# Patient Record
Sex: Female | Born: 1955
Health system: Southern US, Community
[De-identification: ages and names within clinical notes are randomized; demographics above are authoritative.]

## PROBLEM LIST (undated history)

## (undated) DIAGNOSIS — E785 Hyperlipidemia, unspecified: Secondary | ICD-10-CM

## (undated) DIAGNOSIS — I10 Essential (primary) hypertension: Secondary | ICD-10-CM

## (undated) DIAGNOSIS — F329 Major depressive disorder, single episode, unspecified: Secondary | ICD-10-CM

## (undated) DIAGNOSIS — Z8619 Personal history of other infectious and parasitic diseases: Secondary | ICD-10-CM

## (undated) DIAGNOSIS — F32A Depression, unspecified: Secondary | ICD-10-CM

## (undated) DIAGNOSIS — I6381 Other cerebral infarction due to occlusion or stenosis of small artery: Secondary | ICD-10-CM

## (undated) DIAGNOSIS — M65849 Other synovitis and tenosynovitis, unspecified hand: Secondary | ICD-10-CM

## (undated) DIAGNOSIS — M65839 Other synovitis and tenosynovitis, unspecified forearm: Secondary | ICD-10-CM

## (undated) DIAGNOSIS — N852 Hypertrophy of uterus: Secondary | ICD-10-CM

## (undated) HISTORY — DX: Hypertrophy of uterus: N85.2

## (undated) HISTORY — DX: Other synovitis and tenosynovitis, unspecified hand: M65.849

## (undated) HISTORY — PX: POLYPECTOMY: SHX149

## (undated) HISTORY — PX: BACK SURGERY: SHX140

## (undated) HISTORY — PX: COLONOSCOPY: SHX174

## (undated) HISTORY — PX: OTHER SURGICAL HISTORY: SHX169

## (undated) HISTORY — DX: Major depressive disorder, single episode, unspecified: F32.9

## (undated) HISTORY — DX: Personal history of other infectious and parasitic diseases: Z86.19

## (undated) HISTORY — DX: Hyperlipidemia, unspecified: E78.5

## (undated) HISTORY — DX: Other synovitis and tenosynovitis, unspecified forearm: M65.839

## (undated) HISTORY — PX: CERVICAL SPINE SURGERY: SHX589

## (undated) HISTORY — DX: Other cerebral infarction due to occlusion or stenosis of small artery: I63.81

## (undated) HISTORY — DX: Depression, unspecified: F32.A

---

## 1998-12-24 ENCOUNTER — Ambulatory Visit (HOSPITAL_COMMUNITY): Admission: RE | Admit: 1998-12-24 | Discharge: 1998-12-24 | Payer: Self-pay | Admitting: Neurosurgery

## 1998-12-24 ENCOUNTER — Encounter: Payer: Self-pay | Admitting: Neurosurgery

## 1999-01-25 ENCOUNTER — Encounter: Payer: Self-pay | Admitting: Neurosurgery

## 1999-01-27 ENCOUNTER — Encounter: Payer: Self-pay | Admitting: Neurosurgery

## 1999-01-27 ENCOUNTER — Inpatient Hospital Stay (HOSPITAL_COMMUNITY): Admission: RE | Admit: 1999-01-27 | Discharge: 1999-01-28 | Payer: Self-pay | Admitting: Neurosurgery

## 2006-06-27 ENCOUNTER — Ambulatory Visit: Payer: Self-pay | Admitting: Psychiatry

## 2006-07-06 ENCOUNTER — Inpatient Hospital Stay (HOSPITAL_COMMUNITY): Admission: RE | Admit: 2006-07-06 | Discharge: 2006-07-07 | Payer: Self-pay | Admitting: Neurosurgery

## 2006-12-03 ENCOUNTER — Ambulatory Visit: Payer: Self-pay | Admitting: Psychiatry

## 2007-01-07 ENCOUNTER — Ambulatory Visit: Payer: Self-pay | Admitting: Neurosurgery

## 2007-08-05 ENCOUNTER — Ambulatory Visit: Payer: Self-pay

## 2009-08-24 ENCOUNTER — Ambulatory Visit: Payer: Self-pay

## 2009-09-15 ENCOUNTER — Ambulatory Visit: Payer: Self-pay | Admitting: Family Medicine

## 2009-11-09 ENCOUNTER — Ambulatory Visit: Payer: Self-pay | Admitting: Gastroenterology

## 2010-04-24 ENCOUNTER — Ambulatory Visit: Payer: Self-pay | Admitting: Family Medicine

## 2010-08-03 ENCOUNTER — Emergency Department: Payer: Self-pay

## 2010-10-11 ENCOUNTER — Ambulatory Visit: Payer: Self-pay | Admitting: Family Medicine

## 2010-12-12 ENCOUNTER — Ambulatory Visit: Payer: Self-pay | Admitting: Family Medicine

## 2010-12-25 ENCOUNTER — Ambulatory Visit: Payer: Self-pay | Admitting: Family Medicine

## 2011-05-03 ENCOUNTER — Emergency Department: Payer: Self-pay | Admitting: Emergency Medicine

## 2011-05-10 ENCOUNTER — Ambulatory Visit: Payer: Self-pay | Admitting: Family Medicine

## 2011-09-13 ENCOUNTER — Ambulatory Visit: Payer: Self-pay | Admitting: Family Medicine

## 2011-11-13 ENCOUNTER — Ambulatory Visit: Payer: Self-pay | Admitting: Family Medicine

## 2012-04-23 ENCOUNTER — Encounter: Payer: Self-pay | Admitting: *Deleted

## 2012-04-28 ENCOUNTER — Ambulatory Visit: Payer: Self-pay | Admitting: Family Medicine

## 2012-05-01 ENCOUNTER — Encounter: Payer: Self-pay | Admitting: Family Medicine

## 2012-05-22 ENCOUNTER — Ambulatory Visit: Payer: Self-pay | Admitting: Neurology

## 2012-06-02 ENCOUNTER — Encounter: Payer: Self-pay | Admitting: Family Medicine

## 2012-06-02 ENCOUNTER — Ambulatory Visit (INDEPENDENT_AMBULATORY_CARE_PROVIDER_SITE_OTHER): Payer: Managed Care, Other (non HMO) | Admitting: Family Medicine

## 2012-06-02 VITALS — BP 126/74 | HR 58 | Temp 97.9°F | Resp 16 | Ht 65.5 in | Wt 188.2 lb

## 2012-06-02 DIAGNOSIS — E7800 Pure hypercholesterolemia, unspecified: Secondary | ICD-10-CM

## 2012-06-02 DIAGNOSIS — F329 Major depressive disorder, single episode, unspecified: Secondary | ICD-10-CM

## 2012-06-02 DIAGNOSIS — R413 Other amnesia: Secondary | ICD-10-CM

## 2012-06-02 DIAGNOSIS — R51 Headache: Secondary | ICD-10-CM

## 2012-06-02 LAB — CBC WITH DIFFERENTIAL/PLATELET
Basophils Absolute: 0 10*3/uL (ref 0.0–0.1)
Basophils Relative: 0 % (ref 0–1)
Eosinophils Absolute: 0.1 10*3/uL (ref 0.0–0.7)
Lymphs Abs: 3.2 10*3/uL (ref 0.7–4.0)
MCH: 28.2 pg (ref 26.0–34.0)
MCHC: 34.6 g/dL (ref 30.0–36.0)
Neutrophils Relative %: 37 % — ABNORMAL LOW (ref 43–77)
Platelets: 281 10*3/uL (ref 150–400)
RBC: 4.58 MIL/uL (ref 3.87–5.11)
RDW: 13.1 % (ref 11.5–15.5)
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
## 2012-06-02 NOTE — Patient Instructions (Addendum)
1. Pure hypercholesterolemia  CBC with Differential, Comprehensive metabolic panel, CK, Lipid panel  2. Persistent headaches    3. Memory impairment    4. Depression       1.  CONTINUE TO TAKE AMITRIPTYLINE AS RECOMMENDED BY NEUROLOGIST/SHAH. 2. RECOMMEND FOLLOW-UP APPOINTMENT WITH DENNIS CHRISMON, PA-C AT [facility].

## 2012-06-03 LAB — COMPREHENSIVE METABOLIC PANEL
ALT: 21 U/L (ref 0–35)
AST: 26 U/L (ref 0–37)
Alkaline Phosphatase: 61 U/L (ref 39–117)
Sodium: 141 mEq/L (ref 135–145)
Total Bilirubin: 0.4 mg/dL (ref 0.3–1.2)
Total Protein: 7.5 g/dL (ref 6.0–8.3)

## 2012-06-03 LAB — LIPID PANEL
HDL: 60 mg/dL (ref >39)
Total CHOL/HDL Ratio: 2.3 Ratio
VLDL: 15 mg/dL (ref 0–40)

## 2012-08-30 NOTE — Progress Notes (Deleted)
  Subjective:    Patient ID: Alexis [name], female    DOB: 1956/02/01, 57 y.o.   MRN: [id number]  HPI    Review of Systems     Objective:   Physical Exam        Assessment & Plan:

## 2012-08-30 NOTE — Progress Notes (Signed)
Reviewed and agree.

## 2012-12-09 ENCOUNTER — Ambulatory Visit: Payer: Self-pay | Admitting: Family Medicine

## 2012-12-16 ENCOUNTER — Other Ambulatory Visit: Payer: Self-pay | Admitting: Family Medicine

## 2013-10-13 DIAGNOSIS — R4182 Altered mental status, unspecified: Secondary | ICD-10-CM | POA: Insufficient documentation

## 2013-10-13 DIAGNOSIS — E7800 Pure hypercholesterolemia, unspecified: Secondary | ICD-10-CM | POA: Insufficient documentation

## 2013-10-13 DIAGNOSIS — M542 Cervicalgia: Secondary | ICD-10-CM | POA: Insufficient documentation

## 2013-10-13 DIAGNOSIS — R51 Headache: Secondary | ICD-10-CM

## 2013-10-13 DIAGNOSIS — I10 Essential (primary) hypertension: Secondary | ICD-10-CM | POA: Insufficient documentation

## 2013-10-13 DIAGNOSIS — D126 Benign neoplasm of colon, unspecified: Secondary | ICD-10-CM | POA: Insufficient documentation

## 2013-10-13 DIAGNOSIS — K5909 Other constipation: Secondary | ICD-10-CM | POA: Insufficient documentation

## 2013-10-13 DIAGNOSIS — R519 Headache, unspecified: Secondary | ICD-10-CM | POA: Insufficient documentation

## 2014-01-01 ENCOUNTER — Ambulatory Visit: Payer: Self-pay | Admitting: Family Medicine

## 2014-01-21 ENCOUNTER — Ambulatory Visit: Payer: Self-pay | Admitting: Family Medicine

## 2014-11-04 ENCOUNTER — Other Ambulatory Visit: Payer: Self-pay | Admitting: Neurology

## 2014-11-04 DIAGNOSIS — M545 Low back pain: Secondary | ICD-10-CM

## 2014-11-10 ENCOUNTER — Other Ambulatory Visit: Payer: Self-pay | Admitting: Neurology

## 2014-11-19 ENCOUNTER — Ambulatory Visit
Admission: RE | Admit: 2014-11-19 | Discharge: 2014-11-19 | Disposition: A | Discharge status: Home / Self Care | Payer: PPO | Source: Ambulatory Visit | Attending: Neurology | Admitting: Neurology

## 2014-11-19 DIAGNOSIS — M1288 Other specific arthropathies, not elsewhere classified, other specified site: Secondary | ICD-10-CM | POA: Diagnosis not present

## 2014-11-19 DIAGNOSIS — M545 Low back pain: Secondary | ICD-10-CM

## 2014-11-19 DIAGNOSIS — M5430 Sciatica, unspecified side: Secondary | ICD-10-CM | POA: Diagnosis present

## 2014-11-19 DIAGNOSIS — M5126 Other intervertebral disc displacement, lumbar region: Secondary | ICD-10-CM | POA: Insufficient documentation

## 2014-12-07 DIAGNOSIS — M545 Low back pain, unspecified: Secondary | ICD-10-CM | POA: Insufficient documentation

## 2015-01-04 ENCOUNTER — Encounter: Payer: Self-pay | Admitting: Family Medicine

## 2015-01-04 ENCOUNTER — Ambulatory Visit (INDEPENDENT_AMBULATORY_CARE_PROVIDER_SITE_OTHER): Payer: PPO | Admitting: Family Medicine

## 2015-01-04 VITALS — BP 128/72 | HR 64 | Temp 97.7°F | Resp 16 | Ht 65.5 in | Wt 198.0 lb

## 2015-01-04 DIAGNOSIS — I10 Essential (primary) hypertension: Secondary | ICD-10-CM | POA: Diagnosis not present

## 2015-01-04 DIAGNOSIS — R32 Unspecified urinary incontinence: Secondary | ICD-10-CM | POA: Insufficient documentation

## 2015-01-04 DIAGNOSIS — Z1231 Encounter for screening mammogram for malignant neoplasm of breast: Secondary | ICD-10-CM

## 2015-01-04 DIAGNOSIS — R413 Other amnesia: Secondary | ICD-10-CM | POA: Diagnosis not present

## 2015-01-04 DIAGNOSIS — M542 Cervicalgia: Secondary | ICD-10-CM | POA: Diagnosis not present

## 2015-01-04 DIAGNOSIS — F329 Major depressive disorder, single episode, unspecified: Secondary | ICD-10-CM | POA: Diagnosis not present

## 2015-01-04 DIAGNOSIS — G4733 Obstructive sleep apnea (adult) (pediatric): Secondary | ICD-10-CM | POA: Diagnosis not present

## 2015-01-04 DIAGNOSIS — R079 Chest pain, unspecified: Secondary | ICD-10-CM

## 2015-01-04 DIAGNOSIS — R51 Headache: Secondary | ICD-10-CM | POA: Diagnosis not present

## 2015-01-04 DIAGNOSIS — Z01419 Encounter for gynecological examination (general) (routine) without abnormal findings: Secondary | ICD-10-CM

## 2015-01-04 DIAGNOSIS — E7800 Pure hypercholesterolemia, unspecified: Secondary | ICD-10-CM

## 2015-01-04 DIAGNOSIS — K219 Gastro-esophageal reflux disease without esophagitis: Secondary | ICD-10-CM | POA: Diagnosis not present

## 2015-01-04 DIAGNOSIS — I209 Angina pectoris, unspecified: Secondary | ICD-10-CM

## 2015-01-04 DIAGNOSIS — I679 Cerebrovascular disease, unspecified: Secondary | ICD-10-CM | POA: Diagnosis not present

## 2015-01-04 DIAGNOSIS — F32A Depression, unspecified: Secondary | ICD-10-CM

## 2015-01-04 DIAGNOSIS — D219 Benign neoplasm of connective and other soft tissue, unspecified: Secondary | ICD-10-CM | POA: Insufficient documentation

## 2015-01-04 DIAGNOSIS — H9190 Unspecified hearing loss, unspecified ear: Secondary | ICD-10-CM | POA: Insufficient documentation

## 2015-01-04 DIAGNOSIS — G8929 Other chronic pain: Secondary | ICD-10-CM

## 2015-01-04 MED ORDER — METHOCARBAMOL 750 MG PO TABS: 750.0000 mg | ORAL_TABLET | Freq: Three times a day (TID) | ORAL | Status: DC | PRN
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
## 2015-01-04 NOTE — Patient Instructions (Signed)
Please check with your pharmacist to see if your insurance will cover the Shingles Vaccine and the Tdap vaccine

## 2015-01-05 ENCOUNTER — Telehealth: Payer: Self-pay

## 2015-01-05 ENCOUNTER — Encounter: Payer: Self-pay | Admitting: Family Medicine

## 2015-01-05 LAB — ALT: ALT: 27 IU/L (ref 0–32)

## 2015-01-05 LAB — RENAL FUNCTION PANEL
Albumin: 4.5 g/dL (ref 3.6–4.8)
BUN/Creatinine Ratio: 18 (ref 11–26)
BUN: 15 mg/dL (ref 8–27)
CALCIUM: 10.4 mg/dL — AB (ref 8.7–10.3)
CHLORIDE: 102 mmol/L (ref 97–108)
CO2: 27 mmol/L (ref 18–29)
CREATININE: 0.84 mg/dL (ref 0.57–1.00)
GFR calc Af Amer: 87 mL/min/{1.73_m2} (ref >59)
GFR calc non Af Amer: 75 mL/min/{1.73_m2} (ref >59)
Glucose: 86 mg/dL (ref 65–99)
Phosphorus: 3.7 mg/dL (ref 2.5–4.5)
Potassium: 5.2 mmol/L (ref 3.5–5.2)
SODIUM: 142 mmol/L (ref 134–144)

## 2015-01-05 LAB — LIPID PANEL
Chol/HDL Ratio: 3 ratio units (ref 0.0–4.4)
Cholesterol, Total: 222 mg/dL — ABNORMAL HIGH (ref 100–199)
HDL: 74 mg/dL (ref >39)
LDL Calculated: 133 mg/dL — ABNORMAL HIGH (ref 0–99)
TRIGLYCERIDES: 76 mg/dL (ref 0–149)
VLDL Cholesterol Cal: 15 mg/dL (ref 5–40)

## 2015-01-05 LAB — PAP IG (IMAGE GUIDED): PAP Smear Comment: 0

## 2015-01-05 LAB — TSH: TSH: 2.06 u[IU]/mL (ref 0.450–4.500)

## 2015-01-05 NOTE — Telephone Encounter (Signed)
LMTCB 01/05/2015  Thanks,   -Lorielle Boehning  

## 2015-01-05 NOTE — Telephone Encounter (Signed)
-----   Message from Birdie Sons, MD sent at 01/05/2015  9:28 AM EDT ----- Cholesterol a little high at 222. Otherwise labs, including thyroid, liver, kidney functions and electrolytes are all normal. Continue current medications.  Check labs yearly.

## 2015-01-06 NOTE — Telephone Encounter (Signed)
Patient notified of results. Expressed understanding.

## 2015-02-11 ENCOUNTER — Encounter: Payer: Self-pay | Admitting: *Deleted

## 2015-02-13 DIAGNOSIS — G709 Myoneural disorder, unspecified: Secondary | ICD-10-CM | POA: Diagnosis not present

## 2015-02-13 DIAGNOSIS — G43909 Migraine, unspecified, not intractable, without status migrainosus: Secondary | ICD-10-CM | POA: Diagnosis not present

## 2015-02-13 DIAGNOSIS — I10 Essential (primary) hypertension: Secondary | ICD-10-CM | POA: Diagnosis not present

## 2015-02-13 DIAGNOSIS — R1310 Dysphagia, unspecified: Secondary | ICD-10-CM | POA: Diagnosis not present

## 2015-02-13 DIAGNOSIS — E780 Pure hypercholesterolemia: Secondary | ICD-10-CM | POA: Diagnosis not present

## 2015-02-13 DIAGNOSIS — G4730 Sleep apnea, unspecified: Secondary | ICD-10-CM | POA: Diagnosis not present

## 2015-02-13 DIAGNOSIS — K219 Gastro-esophageal reflux disease without esophagitis: Secondary | ICD-10-CM | POA: Diagnosis not present

## 2015-02-13 DIAGNOSIS — Z79899 Other long term (current) drug therapy: Secondary | ICD-10-CM | POA: Diagnosis not present

## 2015-02-13 DIAGNOSIS — K5730 Diverticulosis of large intestine without perforation or abscess without bleeding: Secondary | ICD-10-CM | POA: Diagnosis not present

## 2015-02-13 DIAGNOSIS — D120 Benign neoplasm of cecum: Secondary | ICD-10-CM | POA: Diagnosis not present

## 2015-02-13 DIAGNOSIS — K5909 Other constipation: Secondary | ICD-10-CM | POA: Diagnosis not present

## 2015-02-13 DIAGNOSIS — Z7982 Long term (current) use of aspirin: Secondary | ICD-10-CM | POA: Diagnosis not present

## 2015-02-13 DIAGNOSIS — Z86010 Personal history of colonic polyps: Secondary | ICD-10-CM | POA: Diagnosis present

## 2015-02-13 DIAGNOSIS — F329 Major depressive disorder, single episode, unspecified: Secondary | ICD-10-CM | POA: Diagnosis not present

## 2015-02-13 NOTE — Anesthesia Preprocedure Evaluation (Addendum)
Anesthesia Evaluation  Patient identified by MRN, date of birth, ID band Patient awake    Reviewed: Allergy & Precautions, H&P , NPO status , Patient's Chart, lab work & pertinent test results, reviewed documented beta blocker date and time   Airway Mallampati: II  TM Distance: >3 FB Neck ROM: full    Dental  (+) Upper Dentures, Lower Dentures, Poor Dentition   Pulmonary sleep apnea ,  breath sounds clear to auscultation  Pulmonary exam normal       Cardiovascular Exercise Tolerance: Good hypertension, + angina Normal cardiovascular examRhythm:regular Rate:Normal     Neuro/Psych  Headaches,  Neuromuscular disease negative psych ROS   GI/Hepatic Neg liver ROS, GERD-  Controlled,  Endo/Other  negative endocrine ROS  Renal/GU negative Renal ROS  negative genitourinary   Musculoskeletal   Abdominal   Peds  Hematology negative hematology ROS (+)   Anesthesia Other Findings Past Medical History:   Depressive disorder, not elsewhere classified                Other tenosynovitis of hand and wrist                        Unspecified vitamin D deficiency                             Benign neoplasm of skin, site unspecified                    Degeneration of cervical intervertebral disc                 Memory loss                                                  Asymptomatic varicose veins                                  Mononeuritis of unspecified site                             Abnormality of gait                                          Lacunar infarction                                           Headache(784.0)                                              Hypertrophy of uterus                                          Comment:+ uterine fibroids by pelvic ultrasound 2012   Reproductive/Obstetrics negative OB ROS  Anesthesia Physical Anesthesia Plan  ASA:  III  Anesthesia Plan: General   Post-op Pain Management:    Induction:   Airway Management Planned:   Additional Equipment:   Intra-op Plan:   Post-operative Plan:   Informed Consent: I have reviewed the patients History and Physical, chart, labs and discussed the procedure including the risks, benefits and alternatives for the proposed anesthesia with the patient or authorized representative who has indicated his/her understanding and acceptance.   Dental Advisory Given  Plan Discussed with: Anesthesiologist, CRNA and Surgeon  Anesthesia Plan Comments:         Anesthesia Quick Evaluation

## 2015-02-14 ENCOUNTER — Encounter: Payer: Self-pay | Admitting: Anesthesiology

## 2015-02-14 ENCOUNTER — Encounter
Admission: RE | Disposition: A | Discharge status: Home / Self Care | Payer: Self-pay | Source: Ambulatory Visit | Attending: Gastroenterology

## 2015-02-14 ENCOUNTER — Ambulatory Visit
Admission: RE | Admit: 2015-02-14 | Discharge: 2015-02-14 | Disposition: A | Discharge status: Home / Self Care | Payer: PPO | Source: Ambulatory Visit | Attending: Gastroenterology | Admitting: Gastroenterology

## 2015-02-14 ENCOUNTER — Ambulatory Visit: Payer: PPO | Admitting: Anesthesiology

## 2015-02-14 DIAGNOSIS — G43909 Migraine, unspecified, not intractable, without status migrainosus: Secondary | ICD-10-CM | POA: Insufficient documentation

## 2015-02-14 DIAGNOSIS — R1310 Dysphagia, unspecified: Secondary | ICD-10-CM | POA: Insufficient documentation

## 2015-02-14 DIAGNOSIS — Z7982 Long term (current) use of aspirin: Secondary | ICD-10-CM | POA: Insufficient documentation

## 2015-02-14 DIAGNOSIS — G4730 Sleep apnea, unspecified: Secondary | ICD-10-CM | POA: Insufficient documentation

## 2015-02-14 DIAGNOSIS — E780 Pure hypercholesterolemia: Secondary | ICD-10-CM | POA: Insufficient documentation

## 2015-02-14 DIAGNOSIS — F329 Major depressive disorder, single episode, unspecified: Secondary | ICD-10-CM | POA: Insufficient documentation

## 2015-02-14 DIAGNOSIS — D120 Benign neoplasm of cecum: Secondary | ICD-10-CM | POA: Diagnosis not present

## 2015-02-14 DIAGNOSIS — Z86010 Personal history of colonic polyps: Secondary | ICD-10-CM | POA: Insufficient documentation

## 2015-02-14 DIAGNOSIS — K5909 Other constipation: Secondary | ICD-10-CM | POA: Insufficient documentation

## 2015-02-14 DIAGNOSIS — Z79899 Other long term (current) drug therapy: Secondary | ICD-10-CM | POA: Insufficient documentation

## 2015-02-14 DIAGNOSIS — K219 Gastro-esophageal reflux disease without esophagitis: Secondary | ICD-10-CM | POA: Insufficient documentation

## 2015-02-14 DIAGNOSIS — K5730 Diverticulosis of large intestine without perforation or abscess without bleeding: Secondary | ICD-10-CM | POA: Insufficient documentation

## 2015-02-14 DIAGNOSIS — I10 Essential (primary) hypertension: Secondary | ICD-10-CM | POA: Insufficient documentation

## 2015-02-14 DIAGNOSIS — G709 Myoneural disorder, unspecified: Secondary | ICD-10-CM | POA: Insufficient documentation

## 2015-02-14 HISTORY — PX: COLONOSCOPY WITH PROPOFOL: SHX5780

## 2015-02-14 SURGERY — COLONOSCOPY WITH PROPOFOL
Anesthesia: General

## 2015-02-14 MED ORDER — LIDOCAINE HCL (CARDIAC) 20 MG/ML IV SOLN
INTRAVENOUS | Status: DC | PRN
  Administered 2015-02-14: 20 mg via INTRAVENOUS

## 2015-02-14 MED ORDER — SODIUM CHLORIDE 0.9 % IV SOLN
INTRAVENOUS | Status: DC
  Administered 2015-02-14: 09:00:00 via INTRAVENOUS

## 2015-02-14 MED ORDER — PROPOFOL INFUSION 10 MG/ML OPTIME
INTRAVENOUS | Status: DC | PRN
  Administered 2015-02-14: 150 ug/kg/min via INTRAVENOUS

## 2015-02-14 MED ORDER — PROPOFOL 10 MG/ML IV BOLUS
INTRAVENOUS | Status: DC | PRN
  Administered 2015-02-14: 100 mg via INTRAVENOUS

## 2015-02-14 NOTE — Anesthesia Postprocedure Evaluation (Signed)
  Anesthesia Post-op Note  Patient: Alexis [name]  Procedure(s) Performed: Procedure(s): COLONOSCOPY WITH PROPOFOL (N/A)  Anesthesia type:General  Patient location: PACU  Post pain: Pain level controlled  Post assessment: Post-op Vital signs reviewed, Patient's Cardiovascular Status Stable, Respiratory Function Stable, Patent Airway and No signs of Nausea or vomiting  Post vital signs: Reviewed and stable  Last Vitals:  Filed Vitals:   02/14/15 1020  BP: 148/64  Pulse: 47  Temp:   Resp: 13    Level of consciousness: awake, alert  and patient cooperative  Complications: No apparent anesthesia complications

## 2015-02-14 NOTE — Op Note (Signed)
[facility] Gastroenterology Patient Name: Alexis [name] Procedure Date: 02/14/2015 9:29 AM MRN: [id number] Account #: [id number] Date of Birth: 1956/01/29 Admit Type: Outpatient Age: 59 Room: [facility] ENDO ROOM 4 Gender: Female Note Status: Finalized Procedure:         Colonoscopy Indications:       Personal history of colonic polyps Providers:         Lupita Dawn. Candace Cruise, MD Referring MD:      Kirstie Peri. Caryn Section, MD (Referring MD) Medicines:         Monitored Anesthesia Care Complications:     No immediate complications. Procedure:         Pre-Anesthesia Assessment:                    - Prior to the procedure, a History and Physical was                     performed, and patient medications, allergies and                     sensitivities were reviewed. The patient's tolerance of                     previous anesthesia was reviewed.                    - The risks and benefits of the procedure and the sedation                     options and risks were discussed with the patient. All                     questions were answered and informed consent was obtained.                    - After reviewing the risks and benefits, the patient was                     deemed in satisfactory condition to undergo the procedure.                    After obtaining informed consent, the colonoscope was                     passed under direct vision. Throughout the procedure, the                     patient's blood pressure, pulse, and oxygen saturations                     were monitored continuously. The Colonoscope was                     introduced through the anus and advanced to the the cecum,                     identified by appendiceal orifice and ileocecal valve. Findings:      A diminutive polyp was found in the cecum. The polyp was sessile. The       polyp was removed with a jumbo cold forceps. Resection and retrieval       were complete.      The exam was otherwise without  abnormality. Impression:        - One diminutive polyp in  the cecum. Resected and                     retrieved.                    - The examination was otherwise normal. Recommendation:    - Discharge patient to home.                    - Repeat colonoscopy in 5 years for surveillance.                    - The findings and recommendations were discussed with the                     patient. Procedure Code(s): --- Professional ---                    [phone], Colonoscopy, flexible; with biopsy, single or                     multiple Diagnosis Code(s): --- Professional ---                    D12.0, Benign neoplasm of cecum                    Z86.010, Personal history of colonic polyps CPT copyright 2014 American Medical Association. All rights reserved. The codes documented in this report are preliminary and upon coder review may  be revised to meet current compliance requirements. Hulen Luster, MD 02/14/2015 9:54:43 AM This report has been signed electronically. Number of Addenda: 0 Note Initiated On: 02/14/2015 9:29 AM Scope Withdrawal Time: 0 hours 9 minutes 23 seconds  Total Procedure Duration: 0 hours 12 minutes 18 seconds       [facility]

## 2015-02-14 NOTE — H&P (Signed)
  Date of Initial H&P: 02/02/2015.  History reviewed, patient examined, no change in status, stable for surgery.

## 2015-02-14 NOTE — Transfer of Care (Signed)
Immediate Anesthesia Transfer of Care Note  Patient: Alexis [name]  Procedure(s) Performed: Procedure(s): COLONOSCOPY WITH PROPOFOL (N/A)  Patient Location: PACU  Anesthesia Type:General  Level of Consciousness: awake  Airway & Oxygen Therapy: Patient Spontanous Breathing  Post-op Assessment: Report given to RN  Post vital signs: Reviewed  Last Vitals:  Filed Vitals:   02/14/15 0853  BP: 170/87  Pulse: 54  Temp: 35.8 C  Resp: 19    Complications: No apparent anesthesia complications

## 2015-02-15 LAB — SURGICAL PATHOLOGY

## 2015-02-17 ENCOUNTER — Encounter: Payer: Self-pay | Admitting: Gastroenterology

## 2015-02-25 ENCOUNTER — Ambulatory Visit: Payer: PPO | Admitting: Cardiovascular Disease

## 2015-02-28 ENCOUNTER — Encounter: Payer: Self-pay | Admitting: Family Medicine

## 2015-02-28 DIAGNOSIS — Z86010 Personal history of colonic polyps: Secondary | ICD-10-CM | POA: Insufficient documentation

## 2015-07-20 DIAGNOSIS — G5792 Unspecified mononeuropathy of left lower limb: Secondary | ICD-10-CM | POA: Diagnosis not present

## 2015-07-20 DIAGNOSIS — G959 Disease of spinal cord, unspecified: Secondary | ICD-10-CM | POA: Diagnosis not present

## 2015-07-20 DIAGNOSIS — G43719 Chronic migraine without aura, intractable, without status migrainosus: Secondary | ICD-10-CM | POA: Diagnosis not present

## 2015-09-06 ENCOUNTER — Telehealth: Payer: Self-pay

## 2015-09-06 ENCOUNTER — Emergency Department
Admission: EM | Admit: 2015-09-06 | Discharge: 2015-09-06 | Disposition: A | Discharge status: Home / Self Care | Payer: Commercial Managed Care - HMO | Attending: Emergency Medicine | Admitting: Emergency Medicine

## 2015-09-06 ENCOUNTER — Emergency Department: Payer: Commercial Managed Care - HMO

## 2015-09-06 ENCOUNTER — Encounter: Payer: Self-pay | Admitting: Emergency Medicine

## 2015-09-06 DIAGNOSIS — R51 Headache: Secondary | ICD-10-CM | POA: Diagnosis not present

## 2015-09-06 DIAGNOSIS — G43909 Migraine, unspecified, not intractable, without status migrainosus: Secondary | ICD-10-CM | POA: Diagnosis not present

## 2015-09-06 DIAGNOSIS — Z791 Long term (current) use of non-steroidal anti-inflammatories (NSAID): Secondary | ICD-10-CM | POA: Insufficient documentation

## 2015-09-06 DIAGNOSIS — Z7982 Long term (current) use of aspirin: Secondary | ICD-10-CM | POA: Insufficient documentation

## 2015-09-06 DIAGNOSIS — Z79899 Other long term (current) drug therapy: Secondary | ICD-10-CM | POA: Insufficient documentation

## 2015-09-06 DIAGNOSIS — H9201 Otalgia, right ear: Secondary | ICD-10-CM | POA: Insufficient documentation

## 2015-09-06 DIAGNOSIS — I10 Essential (primary) hypertension: Secondary | ICD-10-CM | POA: Diagnosis not present

## 2015-09-06 MED ORDER — SODIUM CHLORIDE 0.9 % IV BOLUS (SEPSIS)
1000.0000 mL | Freq: Once | INTRAVENOUS | Status: AC
  Administered 2015-09-06: 1000 mL via INTRAVENOUS

## 2015-09-06 MED ORDER — METOCLOPRAMIDE HCL 5 MG/ML IJ SOLN
10.0000 mg | Freq: Once | INTRAMUSCULAR | Status: AC
  Administered 2015-09-06: 10 mg via INTRAVENOUS

## 2015-09-06 MED ORDER — DIPHENHYDRAMINE HCL 50 MG/ML IJ SOLN
INTRAMUSCULAR | Status: AC
  Administered 2015-09-06: 50 mg via INTRAVENOUS
  Filled 2015-09-06: qty 1

## 2015-09-06 MED ORDER — KETOROLAC TROMETHAMINE 30 MG/ML IJ SOLN
30.0000 mg | Freq: Once | INTRAMUSCULAR | Status: AC
  Administered 2015-09-06: 30 mg via INTRAVENOUS

## 2015-09-06 MED ORDER — MORPHINE SULFATE (PF) 4 MG/ML IV SOLN
INTRAVENOUS | Status: AC
  Administered 2015-09-06: 4 mg via INTRAVENOUS
  Filled 2015-09-06: qty 1

## 2015-09-06 MED ORDER — MORPHINE SULFATE (PF) 4 MG/ML IV SOLN
4.0000 mg | Freq: Once | INTRAVENOUS | Status: AC
Start: 2015-09-06 — End: 2015-09-06
  Administered 2015-09-06: 4 mg via INTRAVENOUS

## 2015-09-06 MED ORDER — METOCLOPRAMIDE HCL 5 MG/ML IJ SOLN
INTRAMUSCULAR | Status: AC
  Administered 2015-09-06: 10 mg via INTRAVENOUS
  Filled 2015-09-06: qty 2

## 2015-09-06 MED ORDER — BUTALBITAL-APAP-CAFFEINE 50-325-40 MG PO CAPS: 1.0000 | ORAL_CAPSULE | ORAL | Status: DC | PRN

## 2015-09-06 MED ORDER — DIPHENHYDRAMINE HCL 50 MG/ML IJ SOLN
50.0000 mg | Freq: Once | INTRAMUSCULAR | Status: AC
  Administered 2015-09-06: 50 mg via INTRAVENOUS

## 2015-09-06 MED ORDER — KETOROLAC TROMETHAMINE 30 MG/ML IJ SOLN
INTRAMUSCULAR | Status: AC
  Administered 2015-09-06: 30 mg via INTRAVENOUS
  Filled 2015-09-06: qty 1

## 2015-09-06 NOTE — ED Provider Notes (Signed)
[facility] Emergency Department Provider Note  Time seen: 8:31 PM  I have reviewed the triage vital signs and the nursing notes.   HISTORY  Chief Complaint Headache    HPI Alexis [name] is a 60 y.o. female with a past medical history migraines, who presents to the emergency department with a headache. According to the patient for the past 2-3 days she has had a global headache but more right-sided than left-sided. States she has also felt somewhat off balance. States a history of migraine headaches, took her home migraine medications without reliefso she came to the emergency department for evaluation. Denies any neck pain or stiffness. Denies fever. Does state occasional right ear pain. Some nausea and ataxia she describes as mild. Describes the headache as severe 10/10 currently.     Past Medical History  Diagnosis Date  . Depressive disorder, not elsewhere classified   . Other tenosynovitis of hand and wrist   . Unspecified vitamin D deficiency   . Benign neoplasm of skin, site unspecified   . Degeneration of cervical intervertebral disc   . Memory loss   . Asymptomatic varicose veins   . Mononeuritis of unspecified site   . Abnormality of gait   . Lacunar infarction ([place])   . Headache(784.0)   . Hypertrophy of uterus     + uterine fibroids by pelvic ultrasound 2012    Patient Active Problem List   Diagnosis Date Noted  . History of adenomatous polyp of colon 02/28/2015  . Fibroids 01/04/2015  . Memory difficulty 01/04/2015  . Hearing loss   . Cerebrovascular disease   . Esophageal reflux   . Obstructive sleep apnea   . Urinary incontinence   . Depression   . LBP (low back pain) 12/07/2014  . Benign neoplasm of colon 10/13/2013  . Cervical pain 10/13/2013  . Chronic constipation 10/13/2013  . Benign essential HTN 10/13/2013  . Cephalalgia 10/13/2013  . Hypercholesteremia 10/13/2013  . Abnormal mental state 10/13/2013  . Angina  pectoris ([place]) 10/13/2013    Past Surgical History  Procedure Laterality Date  . Back surgery      disc pressing against a nerve x 2  . Cervical spine surgery      x 3   [facility]  . Varicose vein ligation and stripping      Right  . Rle varicosities  laser surgery     09/2010  . Colonoscopy    . Polypectomy    . Colonoscopy with propofol N/A 02/14/2015    Procedure: COLONOSCOPY WITH PROPOFOL;  Surgeon: Hulen Luster, MD;  Location: [facility] ENDOSCOPY;  Service: Gastroenterology;  Laterality: N/A;    Current Outpatient Rx  Name  Route  Sig  Dispense  Refill  . amitriptyline (ELAVIL) 25 MG tablet   Oral   Take 25 mg by mouth at bedtime.         Marland Kitchen aspirin 81 MG tablet   Oral   Take 81 mg by mouth daily.         Marland Kitchen atorvastatin (LIPITOR) 20 MG tablet   Oral   Take 20 mg by mouth daily.         . butalbital-aspirin-caffeine (FIORINAL) 50-325-40 MG per capsule   Oral   Take 1 capsule by mouth 2 (two) times daily as needed.         . lubiprostone (AMITIZA) 24 MCG capsule   Oral   Take 24 mcg by mouth 2 (two) times daily with a  meal.         . meloxicam (MOBIC) 7.5 MG tablet   Oral   Take 7.5 mg by mouth daily.         . methocarbamol (ROBAXIN) 750 MG tablet   Oral   Take 1 tablet (750 mg total) by mouth every 8 (eight) hours as needed for muscle spasms.   90 tablet   1   . naproxen (NAPROSYN) 500 MG tablet   Oral   Take 500 mg by mouth 2 (two) times daily with a meal.         . oxybutynin (DITROPAN) 5 MG tablet   Oral   Take 5 mg by mouth 2 (two) times daily.         . pregabalin (LYRICA) 150 MG capsule   Oral   Take 150 mg by mouth 2 (two) times daily.         . sertraline (ZOLOFT) 50 MG tablet   Oral   Take 50 mg by mouth daily.         Marland Kitchen tiZANidine (ZANAFLEX) 4 MG capsule   Oral   Take 4 mg by mouth at bedtime.         . topiramate (TOPAMAX) 100 MG tablet   Oral   Take 100 mg by mouth 2 (two) times daily.         Marland Kitchen  triamterene-hydrochlorothiazide (MAXZIDE) 75-50 MG per tablet      Take 1/2 tablet by mouth every day.           Allergies Review of patient's allergies indicates no known allergies.  Family History  Problem Relation Age of Onset  . Diabetes Mother   . Heart disease Mother   . Hypertension Mother   . Aneurysm Mother     stomach and the brain  . Heart disease Sister   . Diabetes Sister   . Hypertension Sister   . Migraines Sister     Social History Social History  Substance Use Topics  . Smoking status: Never Smoker   . Smokeless tobacco: Never Used  . Alcohol Use: No    Review of Systems Constitutional: Negative for fever. ENT: Mild right ear pain. Cardiovascular: Negative for chest pain. Respiratory: Negative for shortness of breath. Gastrointestinal: Negative for abdominal pain Musculoskeletal: Negative for back pain. Negative for neck pain. Skin: Negative for rash. Neurological: Significant headache. Denies focal weakness or numbness. 10-point ROS otherwise negative.  ____________________________________________   PHYSICAL EXAM:  VITAL SIGNS: ED Triage Vitals  Enc Vitals Group     BP 09/06/15 1841 174/81 mmHg     Pulse Rate 09/06/15 1841 64     Resp 09/06/15 1841 20     Temp 09/06/15 1841 97 F (36.1 C)     Temp Source 09/06/15 1841 Oral     SpO2 09/06/15 1841 100 %     Weight 09/06/15 1841 200 lb (90.719 kg)     Height 09/06/15 1841 5\' 5"  (1.651 m)     Head Cir --      Peak Flow --      Pain Score 09/06/15 1841 10     Pain Loc --      Pain Edu? --      Excl. in Saline? --     Constitutional: Alert and oriented. Well appearing and in no distress. Eyes: Normal exam ENT   Head: Normocephalic and atraumatic. Normal ear exam bilaterally without tenderness during exam. Normal neck exam without meningismus.  Nose: No congestion/rhinnorhea.   Mouth/Throat: Mucous membranes are moist. Cardiovascular: Normal rate, regular rhythm. No  murmur Respiratory: Normal respiratory effort without tachypnea nor retractions. Breath sounds are clear Gastrointestinal: Soft and nontender. No distention.  Musculoskeletal: Nontender with normal range of motion in all extremities.  Neurologic:  Normal speech and language. No gross focal neurologic deficits. Equal grip strengths bilaterally. No pronator drift. Intact finger to nose testing. Skin:  Skin is warm, dry and intact.  Psychiatric: Mood and affect are normal. Speech and behavior are normal.   ____________________________________________   INITIAL IMPRESSION / ASSESSMENT AND PLAN / ED COURSE  Pertinent labs & imaging results that were available during my care of the patient were reviewed by me and considered in my medical decision making (see chart for details).  Patient presents the emergency department with a headache 3 days. States a history of migraines, used home medications without relief. Patient also states occasional right ear pain. No mastoid tenderness. Normal-appearing tympanic membranes bilaterally. Patient does have mild to moderate sinus tenderness to percussion bilaterally with a frontal and maxillary sinuses. Denies any recent congestion. Denies a fever. No meningismus on exam. Overall her symptoms are most consistent with a migraine headache. We will obtain a CT scan to rule out more concerning pathology, we'll proceed with Reglan, Toradol, Benadryl and IV fluids for symptomatic relief.   Patient sleeping comfortably. Upon awakening the patient she states the headache is much improved, is ready to go home. We'll discharge the patient home with a prescription for Fioricet if she has any residual headache tomorrow. Patient is to follow up with her primary care doctor tomorrow as well. Patient and family are agreeable.  ____________________________________________   FINAL CLINICAL IMPRESSION(S) / ED DIAGNOSES  Headache   Harvest Dark, MD 09/06/15 2218

## 2015-09-06 NOTE — ED Notes (Signed)
Pt to ed with c/o headache x 3 days.  Pt states hx of migraines, uses topamax, lyrica for same without relief.  Pt also states she has tried motrin and tylenol for pain.

## 2015-09-06 NOTE — Telephone Encounter (Signed)
Alexis [name] sister called at 4:35 that patient wants to be seen today by a provider. Patient is having headaches, some weakness, low grade fever. No chest pain or URI symptoms. Spoke with  Dr. Caryn Section and he advised that we can see patient tomorrow, if patient gets really bad and cannot wait until tomorrow he advises for her to be seen at the urgent care. Put patient on [place] schedule for tomorrow at 8:45.  Thanks,  -Joseline

## 2015-09-06 NOTE — Discharge Instructions (Signed)

## 2015-09-07 ENCOUNTER — Ambulatory Visit: Payer: Self-pay | Admitting: Physician Assistant

## 2015-09-09 DIAGNOSIS — G5792 Unspecified mononeuropathy of left lower limb: Secondary | ICD-10-CM | POA: Diagnosis not present

## 2015-09-09 DIAGNOSIS — G959 Disease of spinal cord, unspecified: Secondary | ICD-10-CM | POA: Diagnosis not present

## 2015-09-09 DIAGNOSIS — G43719 Chronic migraine without aura, intractable, without status migrainosus: Secondary | ICD-10-CM | POA: Diagnosis not present

## 2015-09-09 DIAGNOSIS — F3341 Major depressive disorder, recurrent, in partial remission: Secondary | ICD-10-CM | POA: Diagnosis not present

## 2015-09-21 DIAGNOSIS — G43719 Chronic migraine without aura, intractable, without status migrainosus: Secondary | ICD-10-CM | POA: Diagnosis not present

## 2015-09-28 DIAGNOSIS — G43719 Chronic migraine without aura, intractable, without status migrainosus: Secondary | ICD-10-CM | POA: Diagnosis not present

## 2015-10-05 DIAGNOSIS — G43719 Chronic migraine without aura, intractable, without status migrainosus: Secondary | ICD-10-CM | POA: Diagnosis not present

## 2015-10-24 ENCOUNTER — Other Ambulatory Visit: Payer: Self-pay | Admitting: Family Medicine

## 2015-11-09 ENCOUNTER — Ambulatory Visit (INDEPENDENT_AMBULATORY_CARE_PROVIDER_SITE_OTHER): Payer: Commercial Managed Care - HMO | Admitting: Family Medicine

## 2015-11-09 ENCOUNTER — Encounter: Payer: Self-pay | Admitting: Family Medicine

## 2015-11-09 VITALS — BP 160/78 | HR 55 | Temp 98.5°F | Resp 16 | Wt 190.0 lb

## 2015-11-09 DIAGNOSIS — R519 Headache, unspecified: Secondary | ICD-10-CM

## 2015-11-09 DIAGNOSIS — M5030 Other cervical disc degeneration, unspecified cervical region: Secondary | ICD-10-CM | POA: Insufficient documentation

## 2015-11-09 DIAGNOSIS — G4700 Insomnia, unspecified: Secondary | ICD-10-CM | POA: Insufficient documentation

## 2015-11-09 DIAGNOSIS — R072 Precordial pain: Secondary | ICD-10-CM

## 2015-11-09 DIAGNOSIS — I10 Essential (primary) hypertension: Secondary | ICD-10-CM

## 2015-11-09 DIAGNOSIS — E559 Vitamin D deficiency, unspecified: Secondary | ICD-10-CM | POA: Insufficient documentation

## 2015-11-09 DIAGNOSIS — I8390 Asymptomatic varicose veins of unspecified lower extremity: Secondary | ICD-10-CM | POA: Insufficient documentation

## 2015-11-09 DIAGNOSIS — R51 Headache: Secondary | ICD-10-CM

## 2015-11-09 DIAGNOSIS — Z209 Contact with and (suspected) exposure to unspecified communicable disease: Secondary | ICD-10-CM | POA: Insufficient documentation

## 2015-11-09 DIAGNOSIS — R269 Unspecified abnormalities of gait and mobility: Secondary | ICD-10-CM | POA: Insufficient documentation

## 2015-11-09 DIAGNOSIS — Z8249 Family history of ischemic heart disease and other diseases of the circulatory system: Secondary | ICD-10-CM | POA: Insufficient documentation

## 2015-11-09 DIAGNOSIS — R946 Abnormal results of thyroid function studies: Secondary | ICD-10-CM | POA: Insufficient documentation

## 2015-11-09 MED ORDER — TRIAMTERENE-HCTZ 75-50 MG PO TABS: 1.0000 | ORAL_TABLET | Freq: Every day | ORAL | Status: DC

## 2015-11-09 NOTE — Progress Notes (Signed)
Patient: Alexis [name] Female    DOB: [birth year]   60 y.o.   MRN: [id number] Visit Date: 11/09/2015  Today's Provider: Lelon Huh, MD   Chief Complaint  Patient presents with  . Hypertension    follow up  . Hyperlipidemia    follow up   Subjective:    HPI  Hypertension, follow-up:  BP Readings from Last 3 Encounters:  11/09/15 160/78  09/06/15 174/81  02/14/15 148/64    She was last seen for hypertension 10 months ago.  BP at that visit was 128/72. Management since that visit includes no changes. She reports good compliance with treatment. Patient has been taking a full tablet of Maxzide daily instead of 1/2 tablet. Patient reports she ran out of medication 1 week ago.  She is not having side effects.  She is exercising. She is not adherent to low salt diet.   Outside blood pressures are checked at the pharmacy. Patient does not remember what range her blood pressure runs. She is experiencing fatigue, lower extremity edema and palpitations.  Patient denies chest pain, chest pressure/discomfort, claudication, dyspnea and exertional chest pressure/discomfort Cardiovascular risk factors include dyslipidemia and hypertension.  Use of agents associated with hypertension: NSAIDS.     Weight trend: increasing steadily Wt Readings from Last 3 Encounters:  11/09/15 190 lb (86.183 kg)  09/06/15 200 lb (90.719 kg)  02/14/15 200 lb (90.719 kg)    Current diet: in general, an "unhealthy" diet  ------------------------------------------------------------------------   Lipid/Cholesterol, Follow-up:   Last seen for this10 months ago.  Management changes since that visit include none. . Last Lipid Panel:    Component Value Date/Time   CHOL 222* 01/04/2015 1617   CHOL 136 06/02/2012 1108   TRIG 76 01/04/2015 1617   HDL 74 01/04/2015 1617   HDL 60 06/02/2012 1108   CHOLHDL 3.0 01/04/2015 1617   CHOLHDL 2.3 06/02/2012 1108   VLDL 15 06/02/2012 1108   LDLCALC  133* 01/04/2015 1617   LDLCALC 61 06/02/2012 1108    Risk factors for vascular disease include hypercholesterolemia and hypertension  She reports good compliance with treatment. She is not having side effects.  Current symptoms include visual disturbances and numbness in her left leg  and have been stable. Weight trend: increasing steadily Prior visit with dietician: no Current diet: in general, an "unhealthy" diet Current exercise: cardiovascular workout on exercise equipment  Wt Readings from Last 3 Encounters:  11/09/15 190 lb (86.183 kg)  09/06/15 200 lb (90.719 kg)  02/14/15 200 lb (90.719 kg)    -------------------------------------------------------------------  She reports having brief episodes of chest pain in mid sternum a few times day. Pain is not always exertional, but when it is, it resolved with rest. No radiation. No dyspnea. She reports her sister had 4 vessel bypass surgery last year at the age of 46. Patient did have Myoview in 2009 with a questionable area of ischemia. She was referred to cardiology and was not felt to have ischemia cardiac pain at that time.     No Known Allergies Previous Medications   AMITRIPTYLINE (ELAVIL) 25 MG TABLET    Take 25 mg by mouth at bedtime.   ASPIRIN 81 MG TABLET    Take 81 mg by mouth daily.   ATORVASTATIN (LIPITOR) 20 MG TABLET    Take 20 mg by mouth daily.   BUTALBITAL-APAP-CAFFEINE (CAPACET) 50-325-40 MG CAPSULE    Take 1 capsule by mouth every 4 (four) hours as needed for  pain.   LUBIPROSTONE (AMITIZA) 24 MCG CAPSULE    Take 24 mcg by mouth 2 (two) times daily with a meal.   MELOXICAM (MOBIC) 7.5 MG TABLET    Take 7.5 mg by mouth daily.   METHOCARBAMOL (ROBAXIN) 750 MG TABLET    Take 1 tablet (750 mg total) by mouth every 8 (eight) hours as needed for muscle spasms.   NAPROXEN (NAPROSYN) 500 MG TABLET    Take 500 mg by mouth 2 (two) times daily with a meal.   OXYBUTYNIN (DITROPAN) 5 MG TABLET    Take 5 mg by mouth 2 (two)  times daily.   PREGABALIN (LYRICA) 150 MG CAPSULE    Take 150 mg by mouth 2 (two) times daily.   SERTRALINE (ZOLOFT) 50 MG TABLET    Take 50 mg by mouth daily.   TIZANIDINE (ZANAFLEX) 4 MG CAPSULE    Take 4 mg by mouth at bedtime.   TOPIRAMATE (TOPAMAX) 100 MG TABLET    Take 100 mg by mouth 2 (two) times daily.   TRIAMTERENE-HYDROCHLOROTHIAZIDE (MAXZIDE) 75-50 MG PER TABLET    Take 1/2 tablet by mouth every day.    Review of Systems  Constitutional: Negative for fever, chills, appetite change and fatigue.  Respiratory: Negative for chest tightness and shortness of breath.   Cardiovascular: Positive for palpitations (x 2 months; unchanged ) and leg swelling. Negative for chest pain.  Gastrointestinal: Negative for nausea, vomiting and abdominal pain.  Neurological: Positive for dizziness, numbness (in left leg) and headaches. Negative for weakness.  Psychiatric/Behavioral: Positive for sleep disturbance (trouble staying asleep).    Social History  Substance Use Topics  . Smoking status: Never Smoker   . Smokeless tobacco: Never Used  . Alcohol Use: No   Objective:   BP 160/78 mmHg  Pulse 55  Temp(Src) 98.5 F (36.9 C) (Oral)  Resp 16  Wt 190 lb (86.183 kg)  SpO2 97%  Physical Exam   General Appearance:    Alert, cooperative, no distress, obese.   Eyes:    PERRL, conjunctiva/corneas clear, EOM's intact       Lungs:     Clear to auscultation bilaterally, respirations unlabored  Heart:    Regular rate and rhythm  Neurologic:   Awake, alert, oriented x 3. No apparent focal neurological           defect.           Assessment & Plan:     1. Benign essential HTN BP up today due to running out medication last week. Restart Maxzide. Refill sent to her pharmacy  2. Precordial pain Not typical for cardiac pain, but she does have multiple cardiac risk factors including sister requiring CABG at age of 18.   - Myocardial Perfusion Imaging; Future       Lelon Huh, MD    [facility]

## 2015-11-10 ENCOUNTER — Other Ambulatory Visit: Payer: Self-pay | Admitting: Family Medicine

## 2015-11-10 DIAGNOSIS — Z1231 Encounter for screening mammogram for malignant neoplasm of breast: Secondary | ICD-10-CM

## 2015-11-11 ENCOUNTER — Telehealth: Payer: Self-pay | Admitting: Family Medicine

## 2015-11-11 DIAGNOSIS — Z1239 Encounter for other screening for malignant neoplasm of breast: Secondary | ICD-10-CM

## 2015-11-11 NOTE — Telephone Encounter (Signed)
Order entered in EPIC

## 2015-11-11 NOTE — Telephone Encounter (Signed)
[place] states that correct code for myoview is [id number].Can you please enter into EPIC,Thanks

## 2015-11-15 ENCOUNTER — Other Ambulatory Visit: Payer: Self-pay | Admitting: Family Medicine

## 2015-11-15 ENCOUNTER — Ambulatory Visit
Admission: RE | Admit: 2015-11-15 | Discharge: 2015-11-15 | Disposition: A | Discharge status: Home / Self Care | Payer: Commercial Managed Care - HMO | Source: Ambulatory Visit | Attending: Family Medicine | Admitting: Family Medicine

## 2015-11-15 DIAGNOSIS — Z1231 Encounter for screening mammogram for malignant neoplasm of breast: Secondary | ICD-10-CM

## 2015-11-15 NOTE — Telephone Encounter (Signed)
error 

## 2015-11-22 ENCOUNTER — Ambulatory Visit
Admission: RE | Admit: 2015-11-22 | Discharge: 2015-11-22 | Disposition: A | Discharge status: Home / Self Care | Payer: Commercial Managed Care - HMO | Source: Ambulatory Visit | Attending: Family Medicine | Admitting: Family Medicine

## 2015-11-22 DIAGNOSIS — R079 Chest pain, unspecified: Secondary | ICD-10-CM | POA: Diagnosis not present

## 2015-11-22 DIAGNOSIS — R072 Precordial pain: Secondary | ICD-10-CM | POA: Diagnosis not present

## 2015-11-22 DIAGNOSIS — Z1239 Encounter for other screening for malignant neoplasm of breast: Secondary | ICD-10-CM

## 2015-11-22 LAB — NM MYOCAR MULTI W/SPECT W/WALL MOTION / EF
CHL CUP NUCLEAR SSS: 0
CSEPED: 4 min
CSEPEDS: 0 s
CSEPEW: 5.8 METS
LV dias vol: 81 mL (ref 46–106)
LVSYSVOL: 34 mL
MPHR: 158 {beats}/min
NUC STRESS TID: 1.02
Peak HR: 108 {beats}/min
Percent HR: 68 %
Rest HR: 53 {beats}/min
SDS: 0
SRS: 0

## 2015-11-22 MED ORDER — TECHNETIUM TC 99M SESTAMIBI GENERIC - CARDIOLITE
32.5800 | Freq: Once | INTRAVENOUS | Status: AC | PRN
  Administered 2015-11-22: 32.58 via INTRAVENOUS

## 2015-11-22 MED ORDER — TECHNETIUM TC 99M SESTAMIBI - CARDIOLITE
13.0000 | Freq: Once | INTRAVENOUS | Status: AC | PRN
  Administered 2015-11-22: 10:00:00 13.18 via INTRAVENOUS

## 2015-11-23 ENCOUNTER — Telehealth: Payer: Self-pay | Admitting: *Deleted

## 2015-11-23 NOTE — Telephone Encounter (Signed)
It was normal. She should be getting letter from [facility] with results.

## 2015-11-23 NOTE — Telephone Encounter (Signed)
Patient wanted to know if we have received her mammogram results?

## 2015-11-23 NOTE — Telephone Encounter (Signed)
Pt advised-aa 

## 2016-01-26 DIAGNOSIS — G5792 Unspecified mononeuropathy of left lower limb: Secondary | ICD-10-CM | POA: Diagnosis not present

## 2016-01-26 DIAGNOSIS — G43719 Chronic migraine without aura, intractable, without status migrainosus: Secondary | ICD-10-CM | POA: Diagnosis not present

## 2016-01-26 DIAGNOSIS — G959 Disease of spinal cord, unspecified: Secondary | ICD-10-CM | POA: Diagnosis not present

## 2016-05-15 ENCOUNTER — Telehealth: Payer: Self-pay | Admitting: Family Medicine

## 2016-05-15 NOTE — Telephone Encounter (Signed)
Called Pt to schedule AWV with [facility] - knb °

## 2016-05-23 ENCOUNTER — Ambulatory Visit (INDEPENDENT_AMBULATORY_CARE_PROVIDER_SITE_OTHER): Payer: Commercial Managed Care - HMO | Admitting: Family Medicine

## 2016-05-23 ENCOUNTER — Encounter: Payer: Self-pay | Admitting: Family Medicine

## 2016-05-23 VITALS — BP 130/72 | HR 46 | Temp 97.7°F | Resp 16 | Wt 181.0 lb

## 2016-05-23 DIAGNOSIS — N390 Urinary tract infection, site not specified: Secondary | ICD-10-CM | POA: Diagnosis not present

## 2016-05-23 DIAGNOSIS — R5383 Other fatigue: Secondary | ICD-10-CM | POA: Diagnosis not present

## 2016-05-23 DIAGNOSIS — E559 Vitamin D deficiency, unspecified: Secondary | ICD-10-CM

## 2016-05-23 DIAGNOSIS — E7800 Pure hypercholesterolemia, unspecified: Secondary | ICD-10-CM | POA: Diagnosis not present

## 2016-05-23 DIAGNOSIS — Z23 Encounter for immunization: Secondary | ICD-10-CM

## 2016-05-23 LAB — POCT URINALYSIS DIPSTICK
Bilirubin, UA: NEGATIVE
Glucose, UA: NEGATIVE
KETONES UA: NEGATIVE
Nitrite, UA: POSITIVE
PH UA: 6
PROTEIN UA: NEGATIVE
SPEC GRAV UA: 1.02
UROBILINOGEN UA: 0.2

## 2016-05-23 MED ORDER — SULFAMETHOXAZOLE-TRIMETHOPRIM 800-160 MG PO TABS: 1.0000 | ORAL_TABLET | Freq: Two times a day (BID) | ORAL | 0 refills | Status: AC

## 2016-05-23 NOTE — Progress Notes (Signed)
Patient: Alexis [name] Female    DOB: Jul 13, 1956   60 y.o.   MRN: [id number] Visit Date: 05/23/2016  Today's Provider: Lelon Huh, MD   Chief Complaint  Patient presents with  . Hyperlipidemia    follow up  . Hypertension    follow up   Subjective:    HPI  Hypertension, follow-up:  BP Readings from Last 3 Encounters:  11/09/15 (!) 160/78  09/06/15 (!) 174/81  02/14/15 (!) 148/64    She was last seen for hypertension 6 months ago.  BP at that visit was 160/78. Management since that visit includes restarting Maxzide (patient had been out of medication; refilled during office visit).. She reports good compliance with treatment. She is not having side effects.  She is exercising. She is adherent to low salt diet.   Outside blood pressures are checked at home and patient reports the results have been normal. She is experiencing chest pain.  Patient denies none.   Cardiovascular risk factors include advanced age (older than 42 for men, 58 for women), dyslipidemia and hypertension.  Use of agents associated with hypertension: NSAIDS.     Weight trend: decreasing steadily Wt Readings from Last 3 Encounters:  11/09/15 190 lb (86.2 kg)  09/06/15 200 lb (90.7 kg)  02/14/15 200 lb (90.7 kg)    Current diet: in general, a "healthy" diet    ------------------------------------------------------------------------  Lipid/Cholesterol, Follow-up:   Last seen for this1 years ago.  Management changes since that visit include none. . Last Lipid Panel:    Component Value Date/Time   CHOL 222 (H) 01/04/2015 1617   TRIG 76 01/04/2015 1617   HDL 74 01/04/2015 1617   CHOLHDL 3.0 01/04/2015 1617   CHOLHDL 2.3 06/02/2012 1108   VLDL 15 06/02/2012 1108   LDLCALC 133 (H) 01/04/2015 1617    Risk factors for vascular disease include hypercholesterolemia and hypertension  She reports good compliance with treatment. She is not having side effects.  Current symptoms  include none and have been stable. Weight trend: decreasing steadily Prior visit with dietician: no Current diet: in general, a "healthy" diet   Current exercise: walking  Wt Readings from Last 3 Encounters:  11/09/15 190 lb (86.2 kg)  09/06/15 200 lb (90.7 kg)  02/14/15 200 lb (90.7 kg)    ------------------------------------------------------------------- Urine Problems: Patient states her urine has had a strong odor for the past 2 weeks. Patient has tried drinking water and Cranberry juice but has not found it to be helpful in relieving symptoms. Patient has also had low back pain. Patient denies dysuria, frequent urination, fever, nausea, vomiting or abdominal pain.     No Known Allergies   Current Outpatient Prescriptions:  .  amitriptyline (ELAVIL) 25 MG tablet, Take 25 mg by mouth at bedtime., Disp: , Rfl:  .  aspirin 81 MG tablet, Take 81 mg by mouth daily., Disp: , Rfl:  .  atorvastatin (LIPITOR) 20 MG tablet, Take 20 mg by mouth daily., Disp: , Rfl:  .  Butalbital-APAP-Caffeine (CAPACET) 50-325-40 MG capsule, Take 1 capsule by mouth every 4 (four) hours as needed for pain., Disp: 20 capsule, Rfl: 0 .  lubiprostone (AMITIZA) 24 MCG capsule, Take 24 mcg by mouth 2 (two) times daily with a meal., Disp: , Rfl:  .  meloxicam (MOBIC) 7.5 MG tablet, Take 7.5 mg by mouth daily., Disp: , Rfl:  .  methocarbamol (ROBAXIN) 750 MG tablet, Take 1 tablet (750 mg total) by mouth every 8 (  eight) hours as needed for muscle spasms., Disp: 90 tablet, Rfl: 1 .  naproxen (NAPROSYN) 500 MG tablet, Take 500 mg by mouth 2 (two) times daily with a meal., Disp: , Rfl:  .  oxybutynin (DITROPAN) 5 MG tablet, Take 5 mg by mouth 2 (two) times daily., Disp: , Rfl:  .  pregabalin (LYRICA) 150 MG capsule, Take 150 mg by mouth 2 (two) times daily., Disp: , Rfl:  .  sertraline (ZOLOFT) 50 MG tablet, Take 50 mg by mouth daily., Disp: , Rfl:  .  tiZANidine (ZANAFLEX) 4 MG capsule, Take 4 mg by mouth at bedtime.,  Disp: , Rfl:  .  topiramate (TOPAMAX) 100 MG tablet, Take 100 mg by mouth 2 (two) times daily., Disp: , Rfl:  .  triamterene-hydrochlorothiazide (MAXZIDE) 75-50 MG tablet, Take 1 tablet by mouth daily., Disp: 39 tablet, Rfl: 5  Review of Systems  Constitutional: Positive for fatigue. Negative for appetite change, chills and fever.  Respiratory: Negative for chest tightness and shortness of breath.   Cardiovascular: Positive for chest pain (occasionally has sharp pain in ). Negative for palpitations.  Gastrointestinal: Negative for abdominal pain, nausea and vomiting.  Genitourinary: Negative for dysuria, frequency and hematuria.       Urine has strong Odor  Neurological: Positive for numbness (tingling in hands). Negative for dizziness and weakness.  Psychiatric/Behavioral: Positive for dysphoric mood.    Social History  Substance Use Topics  . Smoking status: Never Smoker  . Smokeless tobacco: Never Used  . Alcohol use No   Objective:   BP 130/72 (BP Location: Left Arm, Patient Position: Sitting, Cuff Size: Large)   Pulse (!) 46   Temp 97.7 F (36.5 C) (Oral)   Resp 16   Wt 181 lb (82.1 kg)   SpO2 100% Comment: room air  BMI 30.12 kg/m   Physical Exam   General Appearance:    Alert, cooperative, no distress  Eyes:    PERRL, conjunctiva/corneas clear, EOM's intact       Lungs:     Clear to auscultation bilaterally, respirations unlabored  Heart:    Regular rate and rhythm  Neurologic:   Awake, alert, oriented x 3. No apparent focal neurological           defect.       Results for orders placed or performed in visit on 05/23/16  POCT Urinalysis Dipstick  Result Value Ref Range   Color, UA yellow    Clarity, UA cloudy    Glucose, UA negative    Bilirubin, UA negative    Ketones, UA negative    Spec Grav, UA 1.020    Blood, UA Moderate (hemolyzed)    pH, UA 6.0    Protein, UA negative    Urobilinogen, UA 0.2    Nitrite, UA Positive    Leukocytes, UA small (1+) (A)  Negative       Assessment & Plan:     1. Urinary tract infection without hematuria, site unspecified  - POCT Urinalysis Dipstick - Urine Culture - sulfamethoxazole-trimethoprim (BACTRIM DS,SEPTRA DS) 800-160 MG tablet; Take 1 tablet by mouth 2 (two) times daily.  Dispense: 14 tablet; Refill: 0  2. Other fatigue  - Lipid panel - Comprehensive metabolic panel - CBC - TSH - EKG 12-Lead  3. Need for influenza vaccination  - Flu Vaccine QUAD 36+ mos IM  4. Hypercholesteremia She is tolerating atorvastatin well with no adverse effects.    - Lipid panel - EKG 12-Lead  5.  Vitamin D deficiency  - VITAMIN D 25 Hydroxy (Vit-D Deficiency, Fractures)     The entirety of the information documented in the History of Present Illness, Review of Systems and Physical Exam were personally obtained by me. Portions of this information were initially documented by Meyer Cory, CMA and reviewed by me for thoroughness and accuracy.    Lelon Huh, MD  [facility]

## 2016-05-24 ENCOUNTER — Telehealth: Payer: Self-pay

## 2016-05-24 LAB — COMPREHENSIVE METABOLIC PANEL
ALT: 14 IU/L (ref 0–32)
AST: 17 IU/L (ref 0–40)
Albumin/Globulin Ratio: 1.7 (ref 1.2–2.2)
Albumin: 4.7 g/dL (ref 3.6–4.8)
Alkaline Phosphatase: 59 IU/L (ref 39–117)
BUN/Creatinine Ratio: 18 (ref 12–28)
BUN: 16 mg/dL (ref 8–27)
Bilirubin Total: 0.6 mg/dL (ref 0.0–1.2)
CALCIUM: 9.7 mg/dL (ref 8.7–10.3)
CO2: 21 mmol/L (ref 18–29)
CREATININE: 0.91 mg/dL (ref 0.57–1.00)
Chloride: 106 mmol/L (ref 96–106)
GFR calc Af Amer: 78 mL/min/{1.73_m2} (ref >59)
GFR, EST NON AFRICAN AMERICAN: 68 mL/min/{1.73_m2} (ref >59)
Globulin, Total: 2.8 g/dL (ref 1.5–4.5)
Glucose: 76 mg/dL (ref 65–99)
Potassium: 4.4 mmol/L (ref 3.5–5.2)
Sodium: 143 mmol/L (ref 134–144)
Total Protein: 7.5 g/dL (ref 6.0–8.5)

## 2016-05-24 LAB — TSH: TSH: 0.968 u[IU]/mL (ref 0.450–4.500)

## 2016-05-24 LAB — LIPID PANEL
CHOL/HDL RATIO: 2.9 ratio (ref 0.0–4.4)
Cholesterol, Total: 199 mg/dL (ref 100–199)
HDL: 69 mg/dL (ref >39)
LDL CALC: 118 mg/dL — AB (ref 0–99)
Triglycerides: 58 mg/dL (ref 0–149)
VLDL Cholesterol Cal: 12 mg/dL (ref 5–40)

## 2016-05-24 LAB — CBC
HEMATOCRIT: 37 % (ref 34.0–46.6)
HEMOGLOBIN: 12.4 g/dL (ref 11.1–15.9)
MCH: 28.1 pg (ref 26.6–33.0)
MCHC: 33.5 g/dL (ref 31.5–35.7)
MCV: 84 fL (ref 79–97)
Platelets: 250 10*3/uL (ref 150–379)
RBC: 4.41 x10E6/uL (ref 3.77–5.28)
RDW: 13.8 % (ref 12.3–15.4)
WBC: 6.3 10*3/uL (ref 3.4–10.8)

## 2016-05-24 LAB — VITAMIN D 25 HYDROXY (VIT D DEFICIENCY, FRACTURES): Vit D, 25-Hydroxy: 14.6 ng/mL — ABNORMAL LOW (ref 30.0–100.0)

## 2016-05-24 NOTE — Telephone Encounter (Signed)
-----   Message from Birdie Sons, MD sent at 05/24/2016  8:17 AM EST ----- Vitamin d levels are very low, rest of labs are normal. Need to start vitamin d 50,000 units one capsule each week. #12 rf x 1. Follow up o.v. And recheck labs in 3-4 months.

## 2016-05-24 NOTE — Telephone Encounter (Signed)
LMOVM for pt to return call 

## 2016-05-24 NOTE — Telephone Encounter (Signed)
LMTCB 05/24/2016  Thanks,   -Laura  

## 2016-05-25 LAB — URINE CULTURE

## 2016-05-25 NOTE — Telephone Encounter (Signed)
LMOVM for pt to return call 

## 2016-05-28 ENCOUNTER — Telehealth: Payer: Self-pay

## 2016-05-28 MED ORDER — VITAMIN D (ERGOCALCIFEROL) 1.25 MG (50000 UNIT) PO CAPS: 50000.0000 [IU] | ORAL_CAPSULE | ORAL | 1 refills | Status: DC

## 2016-05-28 NOTE — Telephone Encounter (Signed)
LMTCB-KW 

## 2016-05-28 NOTE — Telephone Encounter (Signed)
-----   Message from Birdie Sons, MD sent at 05/26/2016  6:18 PM EST ----- Urine culture shows infection sensitive to antibiotic that was prescribed. Symptoms should completely resolve by the time antibiotic is finished. Call back otherwise.

## 2016-05-28 NOTE — Telephone Encounter (Signed)
Patient has been advised. KW 

## 2016-05-28 NOTE — Telephone Encounter (Signed)
Patient has been advised and prescription has been sent to [facility]. KW

## 2016-06-29 DIAGNOSIS — G43719 Chronic migraine without aura, intractable, without status migrainosus: Secondary | ICD-10-CM | POA: Diagnosis not present

## 2016-06-29 DIAGNOSIS — G5792 Unspecified mononeuropathy of left lower limb: Secondary | ICD-10-CM | POA: Diagnosis not present

## 2016-06-29 DIAGNOSIS — G959 Disease of spinal cord, unspecified: Secondary | ICD-10-CM | POA: Diagnosis not present

## 2016-07-12 ENCOUNTER — Telehealth: Payer: Self-pay | Admitting: Family Medicine

## 2016-07-12 NOTE — Telephone Encounter (Signed)
Called Pt to schedule AWV with [facility] - knb °

## 2016-07-24 ENCOUNTER — Ambulatory Visit (INDEPENDENT_AMBULATORY_CARE_PROVIDER_SITE_OTHER): Payer: Medicare HMO | Admitting: Family Medicine

## 2016-07-24 ENCOUNTER — Encounter: Payer: Self-pay | Admitting: Family Medicine

## 2016-07-24 VITALS — BP 130/70 | HR 56 | Temp 97.7°F | Resp 16 | Wt 181.0 lb

## 2016-07-24 DIAGNOSIS — G8929 Other chronic pain: Secondary | ICD-10-CM

## 2016-07-24 DIAGNOSIS — R1013 Epigastric pain: Secondary | ICD-10-CM | POA: Insufficient documentation

## 2016-07-24 DIAGNOSIS — M542 Cervicalgia: Secondary | ICD-10-CM | POA: Diagnosis not present

## 2016-07-24 DIAGNOSIS — R1084 Generalized abdominal pain: Secondary | ICD-10-CM | POA: Diagnosis not present

## 2016-07-24 DIAGNOSIS — R51 Headache: Secondary | ICD-10-CM | POA: Diagnosis not present

## 2016-07-24 LAB — POCT URINALYSIS DIPSTICK
BILIRUBIN UA: NEGATIVE
GLUCOSE UA: NEGATIVE
Ketones, UA: NEGATIVE
NITRITE UA: NEGATIVE
Protein, UA: NEGATIVE
Spec Grav, UA: 1.02
Urobilinogen, UA: 2
pH, UA: 6

## 2016-07-24 MED ORDER — AMOXICILLIN 500 MG PO CAPS: 1000.0000 mg | ORAL_CAPSULE | Freq: Two times a day (BID) | ORAL | 0 refills | Status: AC

## 2016-07-24 MED ORDER — TOPIRAMATE 100 MG PO TABS: 100.0000 mg | ORAL_TABLET | Freq: Two times a day (BID) | ORAL | 6 refills | Status: DC

## 2016-07-24 MED ORDER — GABAPENTIN 300 MG PO CAPS: 300.0000 mg | ORAL_CAPSULE | Freq: Two times a day (BID) | ORAL | 6 refills | Status: DC

## 2016-07-24 MED ORDER — OMEPRAZOLE 40 MG PO CPDR: 40.0000 mg | DELAYED_RELEASE_CAPSULE | Freq: Every day | ORAL | 0 refills | Status: DC

## 2016-07-24 NOTE — Progress Notes (Signed)
Patient: Alexis [name] Female    DOB: 1955/12/15   61 y.o.   MRN: [id number] Visit Date: 07/24/2016  Today's Provider: Lelon Huh, MD   No chief complaint on file.  Subjective:    Patient has had LUQ abdominal pain for to weeks. Patient states pain usually occurs after she eats but has happened at other times like when she is sleeping. Patient denies vomiting, nausea, constipation, or diarrhea. Patient states she has had no change in bowel movement or urine. Patient describes pain as dull ache that can occasionally become sharp. Pain does not radiate.    Abdominal Pain  This is a new problem. The current episode started 1 to 4 weeks ago (2 weeks). The onset quality is gradual. The problem occurs intermittently. Duration: 5 minutes or a little longer. The problem has been gradually worsening. The pain is located in the LUQ. The pain is at a severity of 8/10. The pain is severe. The quality of the pain is cramping. The abdominal pain does not radiate. Pertinent negatives include no anorexia, arthralgias, belching, constipation, diarrhea, dysuria, fever, flatus, frequency, headaches, hematochezia, hematuria, melena, myalgias, nausea, vomiting or weight loss. The pain is aggravated by eating. The pain is relieved by nothing. She has tried nothing for the symptoms. There is no history of Crohn's disease.       No Known Allergies   Current Outpatient Prescriptions:  .  amitriptyline (ELAVIL) 25 MG tablet, Take 25 mg by mouth at bedtime., Disp: , Rfl:  .  aspirin 81 MG tablet, Take 81 mg by mouth daily., Disp: , Rfl:  .  atorvastatin (LIPITOR) 20 MG tablet, Take 20 mg by mouth daily., Disp: , Rfl:  .  Butalbital-APAP-Caffeine (CAPACET) 50-325-40 MG capsule, Take 1 capsule by mouth every 4 (four) hours as needed for pain., Disp: 20 capsule, Rfl: 0 .  lubiprostone (AMITIZA) 24 MCG capsule, Take 24 mcg by mouth 2 (two) times daily with a meal., Disp: , Rfl:  .  meloxicam (MOBIC) 7.5 MG  tablet, Take 7.5 mg by mouth daily., Disp: , Rfl:  .  methocarbamol (ROBAXIN) 750 MG tablet, Take 1 tablet (750 mg total) by mouth every 8 (eight) hours as needed for muscle spasms., Disp: 90 tablet, Rfl: 1 .  naproxen (NAPROSYN) 500 MG tablet, Take 500 mg by mouth 2 (two) times daily with a meal., Disp: , Rfl:  .  oxybutynin (DITROPAN) 5 MG tablet, Take 5 mg by mouth 2 (two) times daily., Disp: , Rfl:  .  Gabapentin 300mg  twice a day prescribe by Dr. Manuella Ghazi last month in place of Lyrica which was too expensive.  .  sertraline (ZOLOFT) 50 MG tablet, Take 50 mg by mouth daily., Disp: , Rfl:  .  tiZANidine (ZANAFLEX) 4 MG capsule, Take 4 mg by mouth at bedtime., Disp: , Rfl:  .  topiramate (TOPAMAX) 100 MG tablet, Take 100 mg by mouth 2 (two) times daily., Disp: , Rfl:  .  triamterene-hydrochlorothiazide (MAXZIDE) 75-50 MG tablet, Take 1 tablet by mouth daily., Disp: 39 tablet, Rfl: 5 .  Vitamin D, Ergocalciferol, (DRISDOL) 50000 units CAPS capsule, Take 1 capsule (50,000 Units total) by mouth every 7 (seven) days., Disp: 12 capsule, Rfl: 1  Review of Systems  Constitutional: Negative for fever and weight loss.  Gastrointestinal: Positive for abdominal pain. Negative for anorexia, constipation, diarrhea, flatus, hematochezia, melena, nausea and vomiting.  Genitourinary: Negative for dysuria, frequency and hematuria.  Musculoskeletal: Negative for arthralgias and  myalgias.  Neurological: Negative for headaches.    Social History  Substance Use Topics  . Smoking status: Never Smoker  . Smokeless tobacco: Never Used  . Alcohol use No   Objective:   BP 130/70 (BP Location: Left Arm, Patient Position: Sitting, Cuff Size: Normal)   Pulse (!) 56   Temp 97.7 F (36.5 C) (Oral)   Resp 16   Wt 181 lb (82.1 kg)   SpO2 99%   BMI 30.12 kg/m   Physical Exam  General Appearance:    Alert, cooperative, no distress  Eyes:    PERRL, conjunctiva/corneas clear, EOM's intact       Lungs:     Clear to  auscultation bilaterally, respirations unlabored  Heart:    Regular rate and rhythm  Abdomen:   bowel sounds present and normal in all 4 quadrants, soft or round. No CVA tenderness. Mild epigastric and LUQ tenderness. No masses.         Assessment & Plan:     1. Generalized abdominal pain  - POCT urinalysis dipstick - Urine culture - Comprehensive metabolic panel - CBC  2. Epigastric pain  - Comprehensive metabolic panel - CBC - H Pylori, IGM, IGG, IGA AB - Amylase - amoxicillin (AMOXIL) 500 MG capsule; Take 2 capsules (1,000 mg total) by mouth 2 (two) times daily.  Dispense: 40 capsule; Refill: 0 - omeprazole (PRILOSEC) 40 MG capsule; Take 1 capsule (40 mg total) by mouth daily.  Dispense: 30 capsule; Refill: 0  3. Chronic nonintractable headache, unspecified headache type  - topiramate (TOPAMAX) 100 MG tablet; Take 1 tablet (100 mg total) by mouth 2 (two) times daily.  Dispense: 60 tablet; Refill: 6  4. Cervical pain Was changed from Lyrica to gabapentin by Dr Manuella Ghazi due to cost. She feels Lyrica worked better but she can't afford and requests refill for gabapentin.  - gabapentin (NEURONTIN) 300 MG capsule; Take 1 capsule (300 mg total) by mouth 2 (two) times daily.  Dispense: 60 capsule; Refill: 6       Lelon Huh, MD  [facility]

## 2016-07-26 ENCOUNTER — Telehealth: Payer: Self-pay

## 2016-07-26 LAB — COMPREHENSIVE METABOLIC PANEL
ALT: 16 IU/L (ref 0–32)
AST: 17 IU/L (ref 0–40)
Albumin/Globulin Ratio: 1.5 (ref 1.2–2.2)
Albumin: 4.4 g/dL (ref 3.6–4.8)
Alkaline Phosphatase: 57 IU/L (ref 39–117)
BUN/Creatinine Ratio: 15 (ref 12–28)
BUN: 12 mg/dL (ref 8–27)
Bilirubin Total: 0.5 mg/dL (ref 0.0–1.2)
CHLORIDE: 104 mmol/L (ref 96–106)
CO2: 27 mmol/L (ref 18–29)
Calcium: 10 mg/dL (ref 8.7–10.3)
Creatinine, Ser: 0.79 mg/dL (ref 0.57–1.00)
GFR calc Af Amer: 93 mL/min/{1.73_m2} (ref >59)
GFR calc non Af Amer: 80 mL/min/{1.73_m2} (ref >59)
GLUCOSE: 87 mg/dL (ref 65–99)
Globulin, Total: 2.9 g/dL (ref 1.5–4.5)
Potassium: 5.8 mmol/L — ABNORMAL HIGH (ref 3.5–5.2)
SODIUM: 142 mmol/L (ref 134–144)
Total Protein: 7.3 g/dL (ref 6.0–8.5)

## 2016-07-26 LAB — H PYLORI, IGM, IGG, IGA AB
H. PYLORI, IGA ABS: 17.3 U — AB (ref 0.0–8.9)
H. pylori, IgG AbS: <0.8 Index Value (ref 0.0–0.7)

## 2016-07-26 LAB — CBC
Hematocrit: 40.3 % (ref 34.0–46.6)
Hemoglobin: 13.2 g/dL (ref 11.1–15.9)
MCH: 27.9 pg (ref 26.6–33.0)
MCHC: 32.8 g/dL (ref 31.5–35.7)
MCV: 85 fL (ref 79–97)
Platelets: 297 x10E3/uL (ref 150–379)
RBC: 4.73 x10E6/uL (ref 3.77–5.28)
RDW: 13.4 % (ref 12.3–15.4)
WBC: 5 x10E3/uL (ref 3.4–10.8)

## 2016-07-26 LAB — URINE CULTURE

## 2016-07-26 LAB — AMYLASE: AMYLASE: 62 U/L (ref 31–124)

## 2016-07-26 MED ORDER — METRONIDAZOLE 500 MG PO TABS: 500.0000 mg | ORAL_TABLET | Freq: Two times a day (BID) | ORAL | 0 refills | Status: AC

## 2016-07-26 NOTE — Telephone Encounter (Signed)
Advised patient of results. Medication was sent into the pharmacy. Patient reports that she has an appt in 2 weeks.

## 2016-07-26 NOTE — Telephone Encounter (Signed)
-----   Message from Birdie Sons, MD sent at 07/26/2016  8:02 AM EST ----- Labs are positive for h. Pylori. Need to continue amoxicillin and omeprazole. Add metronidazole 500mg  twice a day for 14 days (#28). Follow up office visit two weeks.

## 2016-08-20 ENCOUNTER — Encounter: Payer: Self-pay | Admitting: Family Medicine

## 2016-08-20 ENCOUNTER — Ambulatory Visit (INDEPENDENT_AMBULATORY_CARE_PROVIDER_SITE_OTHER): Payer: Medicare HMO | Admitting: Family Medicine

## 2016-08-20 VITALS — BP 132/76 | HR 72 | Temp 97.9°F | Resp 16 | Wt 182.0 lb

## 2016-08-20 DIAGNOSIS — R1013 Epigastric pain: Secondary | ICD-10-CM

## 2016-08-20 DIAGNOSIS — K2970 Gastritis, unspecified, without bleeding: Secondary | ICD-10-CM

## 2016-08-20 DIAGNOSIS — M7550 Bursitis of unspecified shoulder: Secondary | ICD-10-CM

## 2016-08-20 DIAGNOSIS — B9681 Helicobacter pylori [H. pylori] as the cause of diseases classified elsewhere: Secondary | ICD-10-CM | POA: Diagnosis not present

## 2016-08-20 MED ORDER — OMEPRAZOLE 40 MG PO CPDR: 40.0000 mg | DELAYED_RELEASE_CAPSULE | Freq: Every day | ORAL | 0 refills | Status: DC

## 2016-08-20 MED ORDER — NAPROXEN 500 MG PO TABS
500.0000 mg | ORAL_TABLET | Freq: Two times a day (BID) | ORAL | 0 refills | Status: DC
Start: 2016-08-20 — End: 2017-12-27

## 2016-08-20 NOTE — Progress Notes (Signed)
Patient: Alexis [name] Female    DOB: 1956/03/12   61 y.o.   MRN: [id number] Visit Date: 08/20/2016  Today's Provider: Lelon Huh, MD   Chief Complaint  Patient presents with  . Follow-up  . H. Pylori  . Shoulder Pain   Subjective:    HPI H. Pylori, follow up: Patient comes in today for a follow up on recent h. Pylori infection. Patient reports that she still has 2 doses of the antibiotics left to take. Patient reports that her abdominal pain has gotten better since her last OV.   Shoulder pain: Patient reports that for the last 2 weeks, she has had pain in her right shoulder. She reports that the pain is worse when she lifts her arm. She denies any injury. She denies any numbness or tingling. She also reports that the pain does not radiate. She reports that she does go to the gym regularly, but she does the same exercises and does not feel she pulled a muscle. Patient does occasionally take Robaxin which seems to help.     No Known Allergies   Current Outpatient Prescriptions:  .  amitriptyline (ELAVIL) 25 MG tablet, Take 25 mg by mouth at bedtime., Disp: , Rfl:  .  aspirin 81 MG tablet, Take 81 mg by mouth daily., Disp: , Rfl:  .  atorvastatin (LIPITOR) 20 MG tablet, Take 20 mg by mouth daily., Disp: , Rfl:  .  Butalbital-APAP-Caffeine (CAPACET) 50-325-40 MG capsule, Take 1 capsule by mouth every 4 (four) hours as needed for pain., Disp: 20 capsule, Rfl: 0 .  gabapentin (NEURONTIN) 300 MG capsule, Take 1 capsule (300 mg total) by mouth 2 (two) times daily., Disp: 60 capsule, Rfl: 6 .  lubiprostone (AMITIZA) 24 MCG capsule, Take 24 mcg by mouth 2 (two) times daily with a meal., Disp: , Rfl:  .  methocarbamol (ROBAXIN) 750 MG tablet, Take 1 tablet (750 mg total) by mouth every 8 (eight) hours as needed for muscle spasms., Disp: 90 tablet, Rfl: 1 .  omeprazole (PRILOSEC) 40 MG capsule, Take 1 capsule (40 mg total) by mouth daily., Disp: 30 capsule, Rfl: 0 .  oxybutynin  (DITROPAN) 5 MG tablet, Take 5 mg by mouth 2 (two) times daily., Disp: , Rfl:  .  sertraline (ZOLOFT) 50 MG tablet, Take 50 mg by mouth daily., Disp: , Rfl:  .  topiramate (TOPAMAX) 100 MG tablet, Take 1 tablet (100 mg total) by mouth 2 (two) times daily., Disp: 60 tablet, Rfl: 6 .  triamterene-hydrochlorothiazide (MAXZIDE) 75-50 MG tablet, Take 1 tablet by mouth daily., Disp: 39 tablet, Rfl: 5 .  Vitamin D, Ergocalciferol, (DRISDOL) 50000 units CAPS capsule, Take 1 capsule (50,000 Units total) by mouth every 7 (seven) days., Disp: 12 capsule, Rfl: 1  Review of Systems  Constitutional: Positive for activity change. Negative for appetite change, chills, diaphoresis, fatigue, fever and unexpected weight change.  Respiratory: Negative.   Cardiovascular: Negative.   Gastrointestinal: Negative for abdominal pain, blood in stool, constipation, diarrhea, nausea and vomiting.  Musculoskeletal: Positive for arthralgias and myalgias.  Neurological: Negative for dizziness, weakness, numbness and headaches.    Social History  Substance Use Topics  . Smoking status: Never Smoker  . Smokeless tobacco: Never Used  . Alcohol use No   Objective:   BP 132/76 (BP Location: Left Arm, Patient Position: Sitting, Cuff Size: Normal)   Pulse 72   Temp 97.9 F (36.6 C)   Resp 16   Wt  182 lb (82.6 kg)   BMI 30.29 kg/m   Physical Exam   General Appearance:    Alert, cooperative, no distress  Eyes:    PERRL, conjunctiva/corneas clear, EOM's intact       Lungs:     Clear to auscultation bilaterally, respirations unlabored  Heart:    Regular rate and rhythm  Neurologic:   Awake, alert, oriented x 3. No apparent focal neurological           defect.   MS:    Tender over right acromion with positive impingement sign.        Assessment & Plan:     1. Epigastric pain Improved with PPI and treatment for h. Pylori as below. Will keep on PPI since we are prescribing NSAID as below.  - omeprazole (PRILOSEC)  40 MG capsule; Take 1 capsule (40 mg total) by mouth daily.  Dispense: 30 capsule; Refill: 0 - H. pylori breath test  2. Helicobacter pylori gastritis  - H. pylori breath test  3. Subacromial bursitis Call for orthopedic referral if not better next week.  - naproxen (NAPROSYN) 500 MG tablet; Take 1 tablet (500 mg total) by mouth 2 (two) times daily with a meal.  Dispense: 30 tablet; Refill: 0       Lelon Huh, MD  [facility]

## 2016-08-23 LAB — H.PYLORI BREATH TEST (REFLEX): H. PYLORI BREATH TEST: NEGATIVE

## 2016-08-23 LAB — H. PYLORI BREATH TEST

## 2016-08-28 ENCOUNTER — Ambulatory Visit: Payer: Commercial Managed Care - HMO

## 2016-08-28 ENCOUNTER — Ambulatory Visit: Payer: Self-pay | Admitting: Family Medicine

## 2016-08-29 ENCOUNTER — Other Ambulatory Visit: Payer: Self-pay | Admitting: Family Medicine

## 2016-08-29 ENCOUNTER — Ambulatory Visit (INDEPENDENT_AMBULATORY_CARE_PROVIDER_SITE_OTHER): Payer: Medicare HMO

## 2016-08-29 ENCOUNTER — Ambulatory Visit (INDEPENDENT_AMBULATORY_CARE_PROVIDER_SITE_OTHER): Payer: Medicare HMO | Admitting: Family Medicine

## 2016-08-29 VITALS — BP 138/78 | HR 68 | Temp 97.9°F | Ht 65.0 in | Wt 184.2 lb

## 2016-08-29 DIAGNOSIS — R1013 Epigastric pain: Secondary | ICD-10-CM | POA: Diagnosis not present

## 2016-08-29 DIAGNOSIS — E559 Vitamin D deficiency, unspecified: Secondary | ICD-10-CM

## 2016-08-29 DIAGNOSIS — Z0000 Encounter for general adult medical examination without abnormal findings: Secondary | ICD-10-CM

## 2016-08-29 MED ORDER — OMEPRAZOLE 40 MG PO CPDR: 40.0000 mg | DELAYED_RELEASE_CAPSULE | Freq: Every day | ORAL | 2 refills | Status: DC

## 2016-08-29 NOTE — Progress Notes (Signed)
Patient: Alexis [name] Female    DOB: 13-Nov-1955   61 y.o.   MRN: [id number] Visit Date: 08/29/2016  Today's Provider: Lelon Huh, MD   Chief Complaint  Patient presents with  . Annual Exam  . Hypertension    vitamin d deficiency  . Hyperlipidemia   Subjective:    HPI  Vitamin D deficiency From 05/23/2016-labs checked, started vitamin D 50,000 units weekly. Lab Results  Component Value Date   VD25OH 14.6 (L) 05/23/2016      Hypertension, follow-up:  BP Readings from Last 3 Encounters:  08/29/16 138/78  08/20/16 132/76  07/24/16 130/70    She was last seen for hypertension 10 months ago.  BP at that visit was 160/78. Management since that visit includes; restarted maxzide 75-50 qd.She reports good compliance with treatment. She is not having side effects. none She is exercising. She is not adherent to low salt diet.   Outside blood pressures are not checking. She is experiencing none.  Patient denies none.   Cardiovascular risk factors include none.  Use of agents associated with hypertension: none.   ----------------------------------------------------------------    Lipid/Cholesterol, Follow-up:   Last seen for this 3 months ago.  Management since that visit includes; no changes.  Last Lipid Panel:    Component Value Date/Time   CHOL 199 05/23/2016 1603   TRIG 58 05/23/2016 1603   HDL 69 05/23/2016 1603   CHOLHDL 2.9 05/23/2016 1603   CHOLHDL 2.3 06/02/2012 1108   VLDL 15 06/02/2012 1108   LDLCALC 118 (H) 05/23/2016 1603    She reports good compliance with treatment. She is having side effects. none  Wt Readings from Last 3 Encounters:  08/29/16 184 lb 3.2 oz (83.6 kg)  08/20/16 182 lb (82.6 kg)  07/24/16 181 lb (82.1 kg)    ---------------------------------------------------------------- Also she was treated for h. Pylori 07/24/16 and had follow up h pylori breath test 08/20/2016 which was negative. She states she still has a little  burning in stomach when she eats, but it is much improved. Is still taking omeprazole daily but ran out yesterday.    No Known Allergies   Current Outpatient Prescriptions:  .  amitriptyline (ELAVIL) 25 MG tablet, Take 25 mg by mouth at bedtime., Disp: , Rfl:  .  aspirin 81 MG tablet, Take 81 mg by mouth daily., Disp: , Rfl:  .  atorvastatin (LIPITOR) 20 MG tablet, Take 20 mg by mouth daily., Disp: , Rfl:  .  Butalbital-APAP-Caffeine (CAPACET) 50-325-40 MG capsule, Take 1 capsule by mouth every 4 (four) hours as needed for pain., Disp: 20 capsule, Rfl: 0 .  gabapentin (NEURONTIN) 300 MG capsule, Take 1 capsule (300 mg total) by mouth 2 (two) times daily., Disp: 60 capsule, Rfl: 6 .  lubiprostone (AMITIZA) 24 MCG capsule, Take 24 mcg by mouth 2 (two) times daily with a meal., Disp: , Rfl:  .  methocarbamol (ROBAXIN) 750 MG tablet, Take 1 tablet (750 mg total) by mouth every 8 (eight) hours as needed for muscle spasms., Disp: 90 tablet, Rfl: 1 .  naproxen (NAPROSYN) 500 MG tablet, Take 1 tablet (500 mg total) by mouth 2 (two) times daily with a meal., Disp: 30 tablet, Rfl: 0 .  omeprazole (PRILOSEC) 40 MG capsule, Take 1 capsule (40 mg total) by mouth daily., Disp: 30 capsule, Rfl: 0 .  oxybutynin (DITROPAN) 5 MG tablet, Take 5 mg by mouth 2 (two) times daily., Disp: , Rfl:  .  sertraline (  ZOLOFT) 50 MG tablet, Take 50 mg by mouth daily., Disp: , Rfl:  .  topiramate (TOPAMAX) 100 MG tablet, Take 1 tablet (100 mg total) by mouth 2 (two) times daily., Disp: 60 tablet, Rfl: 6 .  triamterene-hydrochlorothiazide (MAXZIDE) 75-50 MG tablet, Take 1 tablet by mouth daily., Disp: 39 tablet, Rfl: 5 .  Vitamin D, Ergocalciferol, (DRISDOL) 50000 units CAPS capsule, Take 1 capsule (50,000 Units total) by mouth every 7 (seven) days., Disp: 12 capsule, Rfl: 1  Review of Systems  Constitutional: Negative for appetite change, chills, fatigue and fever.  Respiratory: Negative for chest tightness and shortness of  breath.   Cardiovascular: Negative for chest pain and palpitations.  Gastrointestinal: Negative for abdominal pain, nausea and vomiting.  Neurological: Negative for dizziness and weakness.    Social History  Substance Use Topics  . Smoking status: Never Smoker  . Smokeless tobacco: Never Used  . Alcohol use No   Objective:  Vital Signs - Last Recorded  Most recent update: 08/29/2016 1:17 PM by Fabio Neighbors, LPN  BP  [id number] (BP Location: Left Arm)     Pulse  68     Temp  97.9 F (36.6 C) (Oral)     Ht  5\' 5"  (1.651 m)     Wt  184 lb 3.2 oz (83.6 kg)      BMI  30.65 kg/m          Physical Exam   General Appearance:    Alert, cooperative, no distress, appears stated age  Head:    Normocephalic, without obvious abnormality, atraumatic  Eyes:    PERRL, conjunctiva/corneas clear, EOM's intact, fundi    benign, both eyes  Ears:    Normal TM's and external ear canals, both ears  Nose:   Nares normal, septum midline, mucosa normal, no drainage    or sinus tenderness  Throat:   Lips, mucosa, and tongue normal; teeth and gums normal  Neck:   Supple, symmetrical, trachea midline, no adenopathy;    thyroid:  no enlargement/tenderness/nodules; no carotid   bruit or JVD  Back:     Symmetric, no curvature, ROM normal, no CVA tenderness  Lungs:     Clear to auscultation bilaterally, respirations unlabored  Chest Wall:    No tenderness or deformity   Heart:    Regular rate and rhythm, S1 and S2 normal, no murmur, rub   or gallop  Breast Exam:    normal appearance, no masses or tenderness  Abdomen:     Soft, non-tender, bowel sounds active all four quadrants,    no masses, no organomegaly  Pelvic:    deferred  Extremities:   Extremities normal, atraumatic, no cyanosis or edema  Pulses:   2+ and symmetric all extremities  Skin:   Skin color, texture, turgor normal, no rashes or lesions  Lymph nodes:   Cervical, supraclavicular, and axillary nodes normal    Neurologic:   CNII-XII intact, normal strength, sensation and reflexes    throughout        Assessment & Plan:     1. Vitamin D deficiency Tolerating weekly vitamin d supplements.  - VITAMIN D 25 Hydroxy (Vit-D Deficiency, Fractures)  2. Epigastric pain S/p treatment for h. Pylori in January. Continue PPi for 4-8 more weeks. If symptoms persist will recheck h. Pylori breath test - omeprazole (PRILOSEC) 40 MG capsule; Take 1 capsule (40 mg total) by mouth daily.  Dispense: 30 capsule; Refill: 2  3. Annual physical exam Normal exam. See  AWV by [facility].        Lelon Huh, MD  [facility]

## 2016-08-29 NOTE — Progress Notes (Signed)
Subjective:   Alexis [name] is a 61 y.o. female who presents for Medicare Annual (Subsequent) preventive examination.  Review of Systems:  N/A  Cardiac Risk Factors include: dyslipidemia     Objective:     Vitals: BP 138/78 (BP Location: Left Arm)   Pulse 68   Temp 97.9 F (36.6 C) (Oral)   Ht 5\' 5"  (1.651 m)   Wt 184 lb 3.2 oz (83.6 kg)   BMI 30.65 kg/m   Body mass index is 30.65 kg/m.   Tobacco History  Smoking Status  . Never Smoker  Smokeless Tobacco  . Never Used     Counseling given: Not Answered   Past Medical History:  Diagnosis Date  . History of chicken pox   . Hypertrophy of uterus    + uterine fibroids by pelvic ultrasound 2012  . Lacunar infarction ([place])   . Other tenosynovitis of hand and wrist    Past Surgical History:  Procedure Laterality Date  . BACK SURGERY     disc pressing against a nerve x 2  . CERVICAL SPINE SURGERY     x 3   [place]  . COLONOSCOPY    . COLONOSCOPY WITH PROPOFOL N/A 02/14/2015   Procedure: COLONOSCOPY WITH PROPOFOL;  Surgeon: Hulen Luster, MD;  Location: [facility] ENDOSCOPY;  Service: Gastroenterology;  Laterality: N/A;  . POLYPECTOMY    . RLE Varicosities  laser surgery    09/2010  . varicose vein ligation and stripping     Right   Family History  Problem Relation Age of Onset  . Diabetes Mother   . Heart disease Mother   . Hypertension Mother   . Aneurysm Mother     stomach and the brain  . Heart disease Sister   . Diabetes Sister   . Hypertension Sister   . Migraines Sister    History  Sexual Activity  . Sexual activity: No    Outpatient Encounter Prescriptions as of 08/29/2016  Medication Sig  . amitriptyline (ELAVIL) 25 MG tablet Take 25 mg by mouth at bedtime.  Marland Kitchen aspirin 81 MG tablet Take 81 mg by mouth daily.  Marland Kitchen atorvastatin (LIPITOR) 20 MG tablet Take 20 mg by mouth daily.  . Butalbital-APAP-Caffeine (CAPACET) 50-325-40 MG capsule Take 1 capsule by mouth every 4 (four) hours as needed for pain.  Marland Kitchen  gabapentin (NEURONTIN) 300 MG capsule Take 1 capsule (300 mg total) by mouth 2 (two) times daily.  Marland Kitchen lubiprostone (AMITIZA) 24 MCG capsule Take 24 mcg by mouth 2 (two) times daily with a meal.  . methocarbamol (ROBAXIN) 750 MG tablet Take 1 tablet (750 mg total) by mouth every 8 (eight) hours as needed for muscle spasms.  . naproxen (NAPROSYN) 500 MG tablet Take 1 tablet (500 mg total) by mouth 2 (two) times daily with a meal.  . omeprazole (PRILOSEC) 40 MG capsule Take 1 capsule (40 mg total) by mouth daily.  Marland Kitchen oxybutynin (DITROPAN) 5 MG tablet Take 5 mg by mouth 2 (two) times daily.  . sertraline (ZOLOFT) 50 MG tablet Take 50 mg by mouth daily.  Marland Kitchen topiramate (TOPAMAX) 100 MG tablet Take 1 tablet (100 mg total) by mouth 2 (two) times daily.  Marland Kitchen triamterene-hydrochlorothiazide (MAXZIDE) 75-50 MG tablet Take 1 tablet by mouth daily.  . Vitamin D, Ergocalciferol, (DRISDOL) 50000 units CAPS capsule Take 1 capsule (50,000 Units total) by mouth every 7 (seven) days.   No facility-administered encounter medications on file as of 08/29/2016.  Activities of Daily Living In your present state of health, do you have any difficulty performing the following activities: 08/29/2016  Hearing? Y  Vision? Y  Difficulty concentrating or making decisions? Y  Walking or climbing stairs? Y  Dressing or bathing? N  Doing errands, shopping? N  Preparing Food and eating ? N  Using the Toilet? N  In the past six months, have you accidently leaked urine? N  Do you have problems with loss of bowel control? N  Managing your Medications? N  Managing your Finances? N  Housekeeping or managing your Housekeeping? N  Some recent data might be hidden    Patient Care Team: Birdie Sons, MD as PCP - General (Family Medicine) Vladimir Crofts, MD as Consulting Physician (Neurology)    Assessment:     Exercise Activities and Dietary recommendations Current Exercise Habits: Structured exercise class, Type of  exercise: walking;strength training/weights, Time (Minutes): > 60, Frequency (Times/Week): 3 (-4 days), Weekly Exercise (Minutes/Week): 0, Intensity: Mild  Goals    . Increase water intake          Starting 08/29/16, I will continue to drink 6-8 glasses of water a day.      Fall Risk Fall Risk  08/29/2016  Falls in the past year? No   Depression Screen PHQ 2/9 Scores 08/29/2016  PHQ - 2 Score 1     Cognitive Function     6CIT Screen 08/29/2016  What Year? 0 points  What month? 0 points  What time? 0 points  Count back from 20 0 points  Months in reverse 2 points  Repeat phrase 2 points  Total Score 4    Immunization History  Administered Date(s) Administered  . Influenza,inj,Quad PF,36+ Mos 05/23/2016   Screening Tests Health Maintenance  Topic Date Due  . ZOSTAVAX  07/16/2017 (Originally 11/19/2013)  . TETANUS/TDAP  07/16/2017 (Originally 11/19/1972)  . MAMMOGRAM  11/14/2017  . PAP SMEAR  01/03/2018  . COLONOSCOPY  02/14/2020  . INFLUENZA VACCINE  Completed  . Hepatitis C Screening  Completed  . HIV Screening  Completed      Plan:  I have personally reviewed and addressed the Medicare Annual Wellness questionnaire and have noted the following in the patient's chart:  A. Medical and social history B. Use of alcohol, tobacco or illicit drugs  C. Current medications and supplements D. Functional ability and status E.  Nutritional status F.  Physical activity G. Advance directives H. List of other physicians I.  Hospitalizations, surgeries, and ER visits in previous 12 months J.  [place] such as hearing and vision if needed, cognitive and depression L. Referrals and appointments - none  In addition, I have reviewed and discussed with patient certain preventive protocols, quality metrics, and best practice recommendations. A written personalized care plan for preventive services as well as general preventive health recommendations were provided to  patient.  See attached scanned questionnaire for additional information.   Signed,  Fabio Neighbors, LPN Nurse Health Advisor   MD Recommendations: None. Pt declined tetanus and zostavax vaccines.   I have reviewed the health advisor's note, was available for consultation, and agree with documentation and plan  Lelon Huh, MD

## 2016-08-29 NOTE — Patient Instructions (Signed)

## 2016-08-30 LAB — VITAMIN D 25 HYDROXY (VIT D DEFICIENCY, FRACTURES): Vit D, 25-Hydroxy: 29.4 ng/mL — ABNORMAL LOW (ref 30.0–100.0)

## 2016-09-18 DIAGNOSIS — M1711 Unilateral primary osteoarthritis, right knee: Secondary | ICD-10-CM | POA: Diagnosis not present

## 2016-09-18 DIAGNOSIS — M5136 Other intervertebral disc degeneration, lumbar region: Secondary | ICD-10-CM | POA: Diagnosis not present

## 2016-09-18 DIAGNOSIS — M1712 Unilateral primary osteoarthritis, left knee: Secondary | ICD-10-CM | POA: Diagnosis not present

## 2016-10-18 ENCOUNTER — Other Ambulatory Visit: Payer: Self-pay | Admitting: Family Medicine

## 2016-10-18 DIAGNOSIS — Z1231 Encounter for screening mammogram for malignant neoplasm of breast: Secondary | ICD-10-CM

## 2016-11-15 ENCOUNTER — Ambulatory Visit
Admission: RE | Admit: 2016-11-15 | Discharge: 2016-11-15 | Disposition: A | Discharge status: Home / Self Care | Payer: Commercial Managed Care - HMO | Source: Ambulatory Visit | Attending: Family Medicine | Admitting: Family Medicine

## 2016-11-15 DIAGNOSIS — Z1231 Encounter for screening mammogram for malignant neoplasm of breast: Secondary | ICD-10-CM

## 2017-02-08 ENCOUNTER — Other Ambulatory Visit: Payer: Self-pay | Admitting: Family Medicine

## 2017-02-08 DIAGNOSIS — R1013 Epigastric pain: Secondary | ICD-10-CM

## 2017-03-19 ENCOUNTER — Other Ambulatory Visit: Payer: Self-pay | Admitting: Family Medicine

## 2017-03-19 DIAGNOSIS — M542 Cervicalgia: Secondary | ICD-10-CM

## 2017-03-20 NOTE — Telephone Encounter (Signed)
Pharmacy requesting refills. Thanks!  

## 2017-05-22 ENCOUNTER — Encounter: Payer: Self-pay | Admitting: Family Medicine

## 2017-05-22 ENCOUNTER — Ambulatory Visit: Payer: Medicare HMO | Admitting: Family Medicine

## 2017-05-22 VITALS — BP 136/82 | HR 58 | Temp 98.6°F | Resp 16 | Wt 194.0 lb

## 2017-05-22 DIAGNOSIS — Z23 Encounter for immunization: Secondary | ICD-10-CM | POA: Diagnosis not present

## 2017-05-22 DIAGNOSIS — R350 Frequency of micturition: Secondary | ICD-10-CM

## 2017-05-22 DIAGNOSIS — M7552 Bursitis of left shoulder: Secondary | ICD-10-CM

## 2017-05-22 DIAGNOSIS — N3000 Acute cystitis without hematuria: Secondary | ICD-10-CM

## 2017-05-22 LAB — POCT URINALYSIS DIPSTICK
Bilirubin, UA: NEGATIVE
Glucose, UA: NEGATIVE
Ketones, UA: NEGATIVE
NITRITE UA: NEGATIVE
PROTEIN UA: NEGATIVE
SPEC GRAV UA: 1.02 (ref 1.010–1.025)
Urobilinogen, UA: 0.2 E.U./dL
pH, UA: 6 (ref 5.0–8.0)

## 2017-05-22 MED ORDER — CIPROFLOXACIN HCL 500 MG PO TABS: 500.0000 mg | ORAL_TABLET | Freq: Two times a day (BID) | ORAL | 0 refills | Status: DC

## 2017-05-22 MED ORDER — PREDNISONE 10 MG PO TABS: ORAL_TABLET | ORAL | 0 refills | Status: AC

## 2017-05-22 NOTE — Progress Notes (Signed)
Patient: Alexis [name] Female    DOB: Nov 17, 1955   61 y.o.   MRN: [id number] Visit Date: 05/22/2017  Today's Provider: Lelon Huh, MD   Chief Complaint  Patient presents with  . Shoulder Pain  . Urinary Frequency   Subjective:    Shoulder Pain   The pain is present in the left shoulder. This is a new problem. The current episode started 1 to 4 weeks ago (3 weeks). The problem occurs constantly. The problem has been unchanged. Associated symptoms include a limited range of motion and stiffness. Pertinent negatives include no fever, numbness or tingling. The symptoms are aggravated by activity. She has tried acetaminophen for the symptoms. The treatment provided no relief.  Urinary Frequency   The current episode started in the past 7 days. The problem has been unchanged. The patient is experiencing no pain. There has been no fever. Associated symptoms include frequency. Pertinent negatives include no chills, hematuria, nausea or vomiting. She has tried nothing for the symptoms.    Patient denies any injury to her left shoulder. She reports that it is very stiff with limited range of motion.   No Known Allergies   Current Outpatient Medications:  .  amitriptyline (ELAVIL) 25 MG tablet, Take 25 mg by mouth at bedtime., Disp: , Rfl:  .  aspirin 81 MG tablet, Take 81 mg by mouth daily., Disp: , Rfl:  .  atorvastatin (LIPITOR) 20 MG tablet, Take 20 mg by mouth daily., Disp: , Rfl:  .  Butalbital-APAP-Caffeine (CAPACET) 50-325-40 MG capsule, Take 1 capsule by mouth every 4 (four) hours as needed for pain., Disp: 20 capsule, Rfl: 0 .  gabapentin (NEURONTIN) 300 MG capsule, TAKE ONE CAPSULE BY MOUTH TWICE DAILY., Disp: 180 capsule, Rfl: 4 .  lubiprostone (AMITIZA) 24 MCG capsule, Take 24 mcg by mouth 2 (two) times daily with a meal., Disp: , Rfl:  .  methocarbamol (ROBAXIN) 750 MG tablet, Take 1 tablet (750 mg total) by mouth every 8 (eight) hours as needed for muscle spasms., Disp:  90 tablet, Rfl: 1 .  naproxen (NAPROSYN) 500 MG tablet, Take 1 tablet (500 mg total) by mouth 2 (two) times daily with a meal., Disp: 30 tablet, Rfl: 0 .  omeprazole (PRILOSEC) 40 MG capsule, TAKE 1 CAPSULE BY MOUTH ONCE DAILY, Disp: 30 capsule, Rfl: 4 .  oxybutynin (DITROPAN) 5 MG tablet, Take 5 mg by mouth 2 (two) times daily., Disp: , Rfl:  .  sertraline (ZOLOFT) 50 MG tablet, Take 50 mg by mouth daily., Disp: , Rfl:  .  topiramate (TOPAMAX) 100 MG tablet, Take 1 tablet (100 mg total) by mouth 2 (two) times daily., Disp: 60 tablet, Rfl: 6 .  triamterene-hydrochlorothiazide (MAXZIDE) 75-50 MG tablet, Take 1 tablet by mouth daily., Disp: 39 tablet, Rfl: 5 .  Vitamin D, Ergocalciferol, (DRISDOL) 50000 units CAPS capsule, Take 1 capsule (50,000 Units total) by mouth every 7 (seven) days., Disp: 12 capsule, Rfl: 1  Review of Systems  Constitutional: Negative for appetite change, chills, fatigue and fever.  Respiratory: Negative for chest tightness and shortness of breath.   Cardiovascular: Negative for chest pain and palpitations.  Gastrointestinal: Negative for abdominal pain, nausea and vomiting.  Genitourinary: Positive for frequency. Negative for hematuria.  Musculoskeletal: Positive for stiffness.  Neurological: Negative for dizziness, tingling, weakness and numbness.    Social History   Tobacco Use  . Smoking status: Never Smoker  . Smokeless tobacco: Never Used  Substance Use Topics  .  Alcohol use: No   Objective:   BP 136/82 (BP Location: Left Arm, Patient Position: Sitting, Cuff Size: Normal)   Pulse (!) 58   Temp 98.6 F (37 C)   Resp 16   Wt 194 lb (88 kg)   SpO2 98%   BMI 32.28 kg/m  Vitals:   05/22/17 0955  BP: 136/82  Pulse: (!) 58  Resp: 16  Temp: 98.6 F (37 C)  SpO2: 98%  Weight: 194 lb (88 kg)     Physical Exam   General Appearance:    Alert, cooperative, no distress  Eyes:    PERRL, conjunctiva/corneas clear, EOM's intact       MS::      Moderately tender anterior left acromium. No gross deformities. Positive impingement. Unable to abcuct>90 degrees.   Neurologic:   Awake, alert, oriented x 3. No apparent focal neurological           defect.       Results for orders placed or performed in visit on 05/22/17  POCT urinalysis dipstick  Result Value Ref Range   Color, UA yellow    Clarity, UA clear    Glucose, UA negative    Bilirubin, UA negative    Ketones, UA negative    Spec Grav, UA 1.020 1.010 - 1.025   Blood, UA non hemolyzed trace    pH, UA 6.0 5.0 - 8.0   Protein, UA negative    Urobilinogen, UA 0.2 0.2 or 1.0 E.U./dL   Nitrite, UA negative    Leukocytes, UA Trace (A) Negative       Assessment & Plan:     1. Subacromial bursitis of left shoulder joint Discusses option of steroid injections verus oral NSAID versus PT versus oral steroids. She does not want injection and has been taking naprosyn without relief. Will try 12 prednisone taper and call for orthopedic referral if not much better at follow up .  - predniSONE (DELTASONE) 10 MG tablet; 6 tablets for 2 days, then 5 for 2 days, then 4 for 2 days, then 3 for 2 days, then 2 for 2 days, then 1 for 2 days.  Dispense: 42 tablet; Refill: 0  2. Urinary frequency  - POCT urinalysis dipstick  3. Acute cystitis without hematuria  - ciprofloxacin (CIPRO) 500 MG tablet; Take 1 tablet (500 mg total) 2 (two) times daily for 7 days by mouth.  Dispense: 14 tablet; Refill: 0 - Urine Culture  4. Influenza vaccine needed  - Flu Vaccine QUAD 6+ mos PF IM (Fluarix Quad PF)        Lelon Huh, MD  [facility]

## 2017-05-24 LAB — URINE CULTURE
MICRO NUMBER:: 81252462
SPECIMEN QUALITY: ADEQUATE

## 2017-06-04 ENCOUNTER — Other Ambulatory Visit: Payer: Self-pay | Admitting: Family Medicine

## 2017-06-04 DIAGNOSIS — R350 Frequency of micturition: Secondary | ICD-10-CM

## 2017-09-02 ENCOUNTER — Ambulatory Visit: Payer: Self-pay

## 2017-09-11 ENCOUNTER — Ambulatory Visit (INDEPENDENT_AMBULATORY_CARE_PROVIDER_SITE_OTHER): Payer: Medicare HMO

## 2017-09-11 VITALS — BP 156/80 | HR 60 | Temp 97.5°F | Ht 65.0 in | Wt 197.2 lb

## 2017-09-11 DIAGNOSIS — Z0000 Encounter for general adult medical examination without abnormal findings: Secondary | ICD-10-CM

## 2017-09-11 NOTE — Patient Instructions (Signed)
Alexis [name] , Thank you for taking time to come for your Medicare Wellness Visit. I appreciate your ongoing commitment to your health goals. Please review the following plan we discussed and let me know if I can assist you in the future.   Screening recommendations/referrals: Colonoscopy: Up to date Mammogram: Up to date Bone Density: N/A Recommended yearly ophthalmology/optometry visit for glaucoma screening and checkup Recommended yearly dental visit for hygiene and checkup  Vaccinations: Influenza vaccine: Up to date Pneumococcal vaccine: N/A Tdap vaccine: Pt declines today.  Shingles vaccine: Pt declines today.     Advanced directives: Advance directive discussed with you today. Even though you declined this today please call our office should you change your mind and we can give you the proper paperwork for you to fill out.  Conditions/risks identified: Obesity- recommend eating 3 small meals a day with 2 healthy protein snacks in between to help aid in weight loss.   Next appointment: 09/20/17 @ 10:00 AM with Dr Caryn Section.   Preventive Care 40-64 Years, Female Preventive care refers to lifestyle choices and visits with your health care provider that can promote health and wellness. What does preventive care include?  A yearly physical exam. This is also called an annual well check.  Dental exams once or twice a year.  Routine eye exams. Ask your health care provider how often you should have your eyes checked.  Personal lifestyle choices, including:  Daily care of your teeth and gums.  Regular physical activity.  Eating a healthy diet.  Avoiding tobacco and drug use.  Limiting alcohol use.  Practicing safe sex.  Taking low-dose aspirin daily starting at age 70.  Taking vitamin and mineral supplements as recommended by your health care provider. What happens during an annual well check? The services and screenings done by your health care provider during your annual  well check will depend on your age, overall health, lifestyle risk factors, and family history of disease. Counseling  Your health care provider may ask you questions about your:  Alcohol use.  Tobacco use.  Drug use.  Emotional well-being.  Home and relationship well-being.  Sexual activity.  Eating habits.  Work and work Statistician.  Method of birth control.  Menstrual cycle.  Pregnancy history. Screening  You may have the following tests or measurements:  Height, weight, and BMI.  Blood pressure.  Lipid and cholesterol levels. These may be checked every 5 years, or more frequently if you are over 33 years old.  Skin check.  Lung cancer screening. You may have this screening every year starting at age 18 if you have a 30-pack-year history of smoking and currently smoke or have quit within the past 15 years.  Fecal occult blood test (FOBT) of the stool. You may have this test every year starting at age 77.  Flexible sigmoidoscopy or colonoscopy. You may have a sigmoidoscopy every 5 years or a colonoscopy every 10 years starting at age 57.  Hepatitis C blood test.  Hepatitis B blood test.  Sexually transmitted disease (STD) testing.  Diabetes screening. This is done by checking your blood sugar (glucose) after you have not eaten for a while (fasting). You may have this done every 1-3 years.  Mammogram. This may be done every 1-2 years. Talk to your health care provider about when you should start having regular mammograms. This may depend on whether you have a family history of breast cancer.  BRCA-related cancer screening. This may be done if you have a  family history of breast, ovarian, tubal, or peritoneal cancers.  Pelvic exam and Pap test. This may be done every 3 years starting at age 83. Starting at age 64, this may be done every 5 years if you have a Pap test in combination with an HPV test.  Bone density scan. This is done to screen for osteoporosis.  You may have this scan if you are at high risk for osteoporosis. Discuss your test results, treatment options, and if necessary, the need for more tests with your health care provider. Vaccines  Your health care provider may recommend certain vaccines, such as:  Influenza vaccine. This is recommended every year.  Tetanus, diphtheria, and acellular pertussis (Tdap, Td) vaccine. You may need a Td booster every 10 years.  Zoster vaccine. You may need this after age 7.  Pneumococcal 13-valent conjugate (PCV13) vaccine. You may need this if you have certain conditions and were not previously vaccinated.  Pneumococcal polysaccharide (PPSV23) vaccine. You may need one or two doses if you smoke cigarettes or if you have certain conditions. Talk to your health care provider about which screenings and vaccines you need and how often you need them. This information is not intended to replace advice given to you by your health care provider. Make sure you discuss any questions you have with your health care provider. Document Released: 07/29/2015 Document Revised: 03/21/2016 Document Reviewed: 05/03/2015 Elsevier Interactive Patient Education  2017 [place] Prevention in the Home Falls can cause injuries. They can happen to people of all ages. There are many things you can do to make your home safe and to help prevent falls. What can I do on the outside of my home?  Regularly fix the edges of walkways and driveways and fix any cracks.  Remove anything that might make you trip as you walk through a door, such as a raised step or threshold.  Trim any bushes or trees on the path to your home.  Use bright outdoor lighting.  Clear any walking paths of anything that might make someone trip, such as rocks or tools.  Regularly check to see if handrails are loose or broken. Make sure that both sides of any steps have handrails.  Any raised decks and porches should have guardrails on  the edges.  Have any leaves, snow, or ice cleared regularly.  Use sand or salt on walking paths during winter.  Clean up any spills in your garage right away. This includes oil or grease spills. What can I do in the bathroom?  Use night lights.  Install grab bars by the toilet and in the tub and shower. Do not use towel bars as grab bars.  Use non-skid mats or decals in the tub or shower.  If you need to sit down in the shower, use a plastic, non-slip stool.  Keep the floor dry. Clean up any water that spills on the floor as soon as it happens.  Remove soap buildup in the tub or shower regularly.  Attach bath mats securely with double-sided non-slip rug tape.  Do not have throw rugs and other things on the floor that can make you trip. What can I do in the bedroom?  Use night lights.  Make sure that you have a light by your bed that is easy to reach.  Do not use any sheets or blankets that are too big for your bed. They should not hang down onto the floor.  Have a firm  chair that has side arms. You can use this for support while you get dressed.  Do not have throw rugs and other things on the floor that can make you trip. What can I do in the kitchen?  Clean up any spills right away.  Avoid walking on wet floors.  Keep items that you use a lot in easy-to-reach places.  If you need to reach something above you, use a strong step stool that has a grab bar.  Keep electrical cords out of the way.  Do not use floor polish or wax that makes floors slippery. If you must use wax, use non-skid floor wax.  Do not have throw rugs and other things on the floor that can make you trip. What can I do with my stairs?  Do not leave any items on the stairs.  Make sure that there are handrails on both sides of the stairs and use them. Fix handrails that are broken or loose. Make sure that handrails are as long as the stairways.  Check any carpeting to make sure that it is firmly  attached to the stairs. Fix any carpet that is loose or worn.  Avoid having throw rugs at the top or bottom of the stairs. If you do have throw rugs, attach them to the floor with carpet tape.  Make sure that you have a light switch at the top of the stairs and the bottom of the stairs. If you do not have them, ask someone to add them for you. What else can I do to help prevent falls?  Wear shoes that:  Do not have high heels.  Have rubber bottoms.  Are comfortable and fit you well.  Are closed at the toe. Do not wear sandals.  If you use a stepladder:  Make sure that it is fully opened. Do not climb a closed stepladder.  Make sure that both sides of the stepladder are locked into place.  Ask someone to hold it for you, if possible.  Clearly mark and make sure that you can see:  Any grab bars or handrails.  First and last steps.  Where the edge of each step is.  Use tools that help you move around (mobility aids) if they are needed. These include:  Canes.  Walkers.  Scooters.  Crutches.  Turn on the lights when you go into a dark area. Replace any light bulbs as soon as they burn out.  Set up your furniture so you have a clear path. Avoid moving your furniture around.  If any of your floors are uneven, fix them.  If there are any pets around you, be aware of where they are.  Review your medicines with your doctor. Some medicines can make you feel dizzy. This can increase your chance of falling. Ask your doctor what other things that you can do to help prevent falls. This information is not intended to replace advice given to you by your health care provider. Make sure you discuss any questions you have with your health care provider. Document Released: 04/28/2009 Document Revised: 12/08/2015 Document Reviewed: 08/06/2014 Elsevier Interactive Patient Education  2017 [employer].
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
## 2017-09-20 ENCOUNTER — Encounter: Payer: Self-pay | Admitting: Family Medicine

## 2017-10-08 ENCOUNTER — Other Ambulatory Visit: Payer: Self-pay | Admitting: Family Medicine

## 2017-10-08 DIAGNOSIS — R1013 Epigastric pain: Secondary | ICD-10-CM

## 2017-12-11 ENCOUNTER — Encounter: Payer: Self-pay | Admitting: Family Medicine

## 2017-12-23 DIAGNOSIS — H524 Presbyopia: Secondary | ICD-10-CM | POA: Diagnosis not present

## 2017-12-23 DIAGNOSIS — E113393 Type 2 diabetes mellitus with moderate nonproliferative diabetic retinopathy without macular edema, bilateral: Secondary | ICD-10-CM | POA: Diagnosis not present

## 2017-12-26 ENCOUNTER — Ambulatory Visit (INDEPENDENT_AMBULATORY_CARE_PROVIDER_SITE_OTHER): Payer: Medicare HMO | Admitting: Family Medicine

## 2017-12-26 ENCOUNTER — Other Ambulatory Visit: Payer: Self-pay | Admitting: Family Medicine

## 2017-12-26 ENCOUNTER — Encounter: Payer: Self-pay | Admitting: Family Medicine

## 2017-12-26 VITALS — BP 100/62 | HR 56 | Temp 98.6°F | Resp 16 | Ht 65.0 in | Wt 193.0 lb

## 2017-12-26 DIAGNOSIS — I10 Essential (primary) hypertension: Secondary | ICD-10-CM

## 2017-12-26 DIAGNOSIS — Z6832 Body mass index (BMI) 32.0-32.9, adult: Secondary | ICD-10-CM

## 2017-12-26 DIAGNOSIS — Z0000 Encounter for general adult medical examination without abnormal findings: Secondary | ICD-10-CM | POA: Diagnosis not present

## 2017-12-26 DIAGNOSIS — E7800 Pure hypercholesterolemia, unspecified: Secondary | ICD-10-CM

## 2017-12-26 DIAGNOSIS — Z833 Family history of diabetes mellitus: Secondary | ICD-10-CM

## 2017-12-26 DIAGNOSIS — E559 Vitamin D deficiency, unspecified: Secondary | ICD-10-CM

## 2017-12-26 DIAGNOSIS — Z1231 Encounter for screening mammogram for malignant neoplasm of breast: Secondary | ICD-10-CM

## 2017-12-26 NOTE — Patient Instructions (Addendum)
   You are due for a Tdap (tetanus-dipteheria-pertussis) vaccine. Check with your pharmacy plan regarding coverage for this vaccine
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
## 2017-12-27 ENCOUNTER — Telehealth: Payer: Self-pay | Admitting: *Deleted

## 2017-12-27 DIAGNOSIS — M7550 Bursitis of unspecified shoulder: Secondary | ICD-10-CM

## 2017-12-27 LAB — COMPREHENSIVE METABOLIC PANEL
A/G RATIO: 1.6 (ref 1.2–2.2)
ALK PHOS: 61 IU/L (ref 39–117)
ALT: 23 IU/L (ref 0–32)
AST: 24 IU/L (ref 0–40)
Albumin: 4.5 g/dL (ref 3.6–4.8)
BUN/Creatinine Ratio: 16 (ref 12–28)
BUN: 13 mg/dL (ref 8–27)
Bilirubin Total: 0.6 mg/dL (ref 0.0–1.2)
CALCIUM: 10 mg/dL (ref 8.7–10.3)
CHLORIDE: 102 mmol/L (ref 96–106)
CO2: 24 mmol/L (ref 20–29)
Creatinine, Ser: 0.83 mg/dL (ref 0.57–1.00)
GFR calc Af Amer: 86 mL/min/{1.73_m2} (ref >59)
GFR, EST NON AFRICAN AMERICAN: 75 mL/min/{1.73_m2} (ref >59)
Globulin, Total: 2.9 g/dL (ref 1.5–4.5)
Glucose: 79 mg/dL (ref 65–99)
Potassium: 4.4 mmol/L (ref 3.5–5.2)
Sodium: 140 mmol/L (ref 134–144)
Total Protein: 7.4 g/dL (ref 6.0–8.5)

## 2017-12-27 LAB — VITAMIN D 25 HYDROXY (VIT D DEFICIENCY, FRACTURES): VIT D 25 HYDROXY: 19.3 ng/mL — AB (ref 30.0–100.0)

## 2017-12-27 LAB — LIPID PANEL
CHOLESTEROL TOTAL: 242 mg/dL — AB (ref 100–199)
Chol/HDL Ratio: 3.6 ratio (ref 0.0–4.4)
HDL: 67 mg/dL (ref >39)
LDL Calculated: 155 mg/dL — ABNORMAL HIGH (ref 0–99)
Triglycerides: 98 mg/dL (ref 0–149)
VLDL Cholesterol Cal: 20 mg/dL (ref 5–40)

## 2017-12-27 LAB — HEMOGLOBIN A1C
ESTIMATED AVERAGE GLUCOSE: 123 mg/dL
HEMOGLOBIN A1C: 5.9 % — AB (ref 4.8–5.6)

## 2017-12-27 MED ORDER — BUTALBITAL-APAP-CAFFEINE 50-325-40 MG PO CAPS: 1.0000 | ORAL_CAPSULE | ORAL | 1 refills | Status: DC | PRN

## 2017-12-27 MED ORDER — ATORVASTATIN CALCIUM 20 MG PO TABS: 20.0000 mg | ORAL_TABLET | Freq: Every day | ORAL | 4 refills | Status: DC

## 2017-12-27 MED ORDER — VITAMIN D (ERGOCALCIFEROL) 1.25 MG (50000 UNIT) PO CAPS: 50000.0000 [IU] | ORAL_CAPSULE | ORAL | 4 refills | Status: DC

## 2017-12-27 MED ORDER — NAPROXEN 500 MG PO TABS: 500.0000 mg | ORAL_TABLET | Freq: Two times a day (BID) | ORAL | 1 refills | Status: DC

## 2017-12-27 NOTE — Telephone Encounter (Signed)
Patient was notified of results. Patient stated she has not taken either medication in over a month. Patient stated she needs refills for atorvastatin, vitamin D, Capacet, and naproxen.

## 2017-12-27 NOTE — Telephone Encounter (Signed)
-----   Message from Birdie Sons, MD sent at 12/27/2017  7:59 AM EDT ----- Cholesterol is up quite a bit to 242. Please verify whether she is still taking atorvastatin, if so then will need to change to something stronger.  Also, vitamin D level is very low, please verify whether she is taking weekly vitamin d supplement.

## 2018-01-06 ENCOUNTER — Ambulatory Visit
Admission: RE | Admit: 2018-01-06 | Discharge: 2018-01-06 | Disposition: A | Discharge status: Home / Self Care | Payer: Medicare HMO | Source: Ambulatory Visit | Attending: Family Medicine | Admitting: Family Medicine

## 2018-01-06 DIAGNOSIS — Z1231 Encounter for screening mammogram for malignant neoplasm of breast: Secondary | ICD-10-CM | POA: Diagnosis not present

## 2018-01-22 DIAGNOSIS — N201 Calculus of ureter: Secondary | ICD-10-CM | POA: Diagnosis not present

## 2018-01-22 DIAGNOSIS — R630 Anorexia: Secondary | ICD-10-CM | POA: Diagnosis not present

## 2018-01-22 DIAGNOSIS — N12 Tubulo-interstitial nephritis, not specified as acute or chronic: Secondary | ICD-10-CM | POA: Diagnosis not present

## 2018-01-22 DIAGNOSIS — R1031 Right lower quadrant pain: Secondary | ICD-10-CM | POA: Diagnosis not present

## 2018-01-22 DIAGNOSIS — E119 Type 2 diabetes mellitus without complications: Secondary | ICD-10-CM | POA: Diagnosis not present

## 2018-01-22 DIAGNOSIS — I10 Essential (primary) hypertension: Secondary | ICD-10-CM | POA: Diagnosis not present

## 2018-01-22 DIAGNOSIS — N23 Unspecified renal colic: Secondary | ICD-10-CM | POA: Diagnosis not present

## 2018-01-22 DIAGNOSIS — R112 Nausea with vomiting, unspecified: Secondary | ICD-10-CM | POA: Diagnosis not present

## 2018-01-22 DIAGNOSIS — D259 Leiomyoma of uterus, unspecified: Secondary | ICD-10-CM | POA: Diagnosis not present

## 2018-01-22 DIAGNOSIS — N1330 Unspecified hydronephrosis: Secondary | ICD-10-CM | POA: Diagnosis not present

## 2018-01-22 DIAGNOSIS — R102 Pelvic and perineal pain: Secondary | ICD-10-CM | POA: Diagnosis not present

## 2018-01-22 DIAGNOSIS — R001 Bradycardia, unspecified: Secondary | ICD-10-CM | POA: Diagnosis not present

## 2018-01-22 DIAGNOSIS — Z960 Presence of urogenital implants: Secondary | ICD-10-CM | POA: Diagnosis not present

## 2018-01-22 DIAGNOSIS — Z87442 Personal history of urinary calculi: Secondary | ICD-10-CM | POA: Diagnosis not present

## 2018-01-22 DIAGNOSIS — R1900 Intra-abdominal and pelvic swelling, mass and lump, unspecified site: Secondary | ICD-10-CM | POA: Diagnosis not present

## 2018-01-22 DIAGNOSIS — Z79899 Other long term (current) drug therapy: Secondary | ICD-10-CM | POA: Diagnosis not present

## 2018-01-22 DIAGNOSIS — R109 Unspecified abdominal pain: Secondary | ICD-10-CM | POA: Diagnosis not present

## 2018-01-22 DIAGNOSIS — R5082 Postprocedural fever: Secondary | ICD-10-CM | POA: Diagnosis not present

## 2018-01-22 DIAGNOSIS — R509 Fever, unspecified: Secondary | ICD-10-CM | POA: Diagnosis not present

## 2018-01-22 DIAGNOSIS — R110 Nausea: Secondary | ICD-10-CM | POA: Diagnosis not present

## 2018-01-23 DIAGNOSIS — N1330 Unspecified hydronephrosis: Secondary | ICD-10-CM | POA: Diagnosis not present

## 2018-01-23 DIAGNOSIS — R630 Anorexia: Secondary | ICD-10-CM | POA: Diagnosis not present

## 2018-01-23 DIAGNOSIS — R1900 Intra-abdominal and pelvic swelling, mass and lump, unspecified site: Secondary | ICD-10-CM | POA: Diagnosis not present

## 2018-01-23 DIAGNOSIS — I10 Essential (primary) hypertension: Secondary | ICD-10-CM | POA: Diagnosis not present

## 2018-01-23 DIAGNOSIS — E119 Type 2 diabetes mellitus without complications: Secondary | ICD-10-CM | POA: Diagnosis not present

## 2018-01-23 DIAGNOSIS — Z79899 Other long term (current) drug therapy: Secondary | ICD-10-CM | POA: Diagnosis not present

## 2018-01-23 DIAGNOSIS — R509 Fever, unspecified: Secondary | ICD-10-CM | POA: Diagnosis not present

## 2018-01-23 DIAGNOSIS — N23 Unspecified renal colic: Secondary | ICD-10-CM | POA: Diagnosis not present

## 2018-01-23 DIAGNOSIS — N201 Calculus of ureter: Secondary | ICD-10-CM | POA: Diagnosis not present

## 2018-01-25 DIAGNOSIS — R110 Nausea: Secondary | ICD-10-CM | POA: Diagnosis not present

## 2018-01-25 DIAGNOSIS — N12 Tubulo-interstitial nephritis, not specified as acute or chronic: Secondary | ICD-10-CM | POA: Diagnosis not present

## 2018-01-25 DIAGNOSIS — I10 Essential (primary) hypertension: Secondary | ICD-10-CM | POA: Diagnosis not present

## 2018-01-25 DIAGNOSIS — R5082 Postprocedural fever: Secondary | ICD-10-CM | POA: Diagnosis not present

## 2018-01-25 DIAGNOSIS — Z960 Presence of urogenital implants: Secondary | ICD-10-CM | POA: Diagnosis not present

## 2018-01-25 DIAGNOSIS — R109 Unspecified abdominal pain: Secondary | ICD-10-CM | POA: Diagnosis not present

## 2018-01-25 DIAGNOSIS — Z87442 Personal history of urinary calculi: Secondary | ICD-10-CM | POA: Diagnosis not present

## 2018-01-25 DIAGNOSIS — R509 Fever, unspecified: Secondary | ICD-10-CM | POA: Diagnosis not present

## 2018-01-25 DIAGNOSIS — E119 Type 2 diabetes mellitus without complications: Secondary | ICD-10-CM | POA: Diagnosis not present

## 2018-01-26 DIAGNOSIS — R5082 Postprocedural fever: Secondary | ICD-10-CM | POA: Diagnosis not present

## 2018-02-10 NOTE — Progress Notes (Signed)
Patient: Alexis [name] Female    DOB: 1955-12-24   62 y.o.   MRN: [id number] Visit Date: 02/11/2018  Today's Provider: Lelon Huh, MD   Chief Complaint  Patient presents with  . Referral    OBG-YN   Subjective:    HPI  Patient states she was in the hospital in [place] 2 weeks ago for kidney stone. Patient states when she had kidney stone remove or blasted, the surgeon advised her to see an OBG-YN due to a mass on her ovaries that was view during procedure.   Have sent for records and reviewed report of Abdominal CT with contrast performed 01/22/2018 with following description of pelvic mass. There is a large pelvic mass.  This appears to be uterine origin.  This measures 11.4 x 7.7 x 8.8 cm in size. This may be complex cystic attenuation values are less than surrounding enhancing uterine wall but greater than expected for simple fluid.  There is a single septation running through this mass.  Scattered calcifications are seen within the anterior component.  Additional calcified mass is seen within the posterior lateral right aspect of the uterus suggesting old fibroid.  This measures up to 3 cm in size.  Contiguous with the right aspect of this mass is what appears to be the right ovary measuring 4 x 3.2 cm in greatest axial dimension and demonstrating internal enhancing rounded structures with the larger measuring 2.2 cm in size.  Left ovary appears unremarkable.  No free air or free fluid is seen within the abdomen or pelvis.  No pathological nodal enlargement is seen.   Also patient would like to discuss left heal pain that she has had for 2 weeks.   No Known Allergies   Current Outpatient Medications:  .  Acetaminophen (TYLENOL PO), Take by mouth as needed., Disp: , Rfl:  .  aspirin 81 MG tablet, Take 81 mg by mouth daily., Disp: , Rfl:  .  atorvastatin (LIPITOR) 20 MG tablet, Take 1 tablet (20 mg total) by mouth daily., Disp: 90 tablet, Rfl: 4 .  Butalbital-APAP-Caffeine  (CAPACET) 50-325-40 MG capsule, Take 1 capsule by mouth every 4 (four) hours as needed for pain., Disp: 20 capsule, Rfl: 1 .  gabapentin (NEURONTIN) 300 MG capsule, TAKE ONE CAPSULE BY MOUTH TWICE DAILY., Disp: 180 capsule, Rfl: 4 .  naproxen (NAPROSYN) 500 MG tablet, Take 1 tablet (500 mg total) by mouth 2 (two) times daily with a meal., Disp: 30 tablet, Rfl: 1 .  oxybutynin (DITROPAN) 5 MG tablet, Take 5 mg by mouth 2 (two) times daily., Disp: , Rfl:  .  topiramate (TOPAMAX) 100 MG tablet, Take 1 tablet (100 mg total) by mouth 2 (two) times daily., Disp: 60 tablet, Rfl: 6 .  triamterene-hydrochlorothiazide (MAXZIDE) 75-50 MG tablet, Take 1 tablet by mouth daily., Disp: 39 tablet, Rfl: 5 .  Vitamin D, Ergocalciferol, (DRISDOL) 50000 units CAPS capsule, Take 1 capsule (50,000 Units total) by mouth every 7 (seven) days., Disp: 12 capsule, Rfl: 4 .  lubiprostone (AMITIZA) 24 MCG capsule, Take 24 mcg by mouth 2 (two) times daily with a meal., Disp: , Rfl:   Review of Systems  Constitutional: Negative for appetite change, chills, fatigue and fever.  Respiratory: Negative for chest tightness and shortness of breath.   Cardiovascular: Negative for chest pain and palpitations.  Gastrointestinal: Negative for abdominal pain, nausea and vomiting.  Neurological: Negative for dizziness and weakness.    Social History  Tobacco Use  . Smoking status: Never Smoker  . Smokeless tobacco: Never Used  Substance Use Topics  . Alcohol use: No   Objective:   BP (!) 149/75 (BP Location: Right Arm, Patient Position: Sitting, Cuff Size: Large)   Pulse 84   Temp 98 F (36.7 C) (Oral)   Resp 16   Ht 5\' 5"  (1.651 m)   Wt 198 lb (89.8 kg)   SpO2 97%   BMI 32.95 kg/m  Vitals:   02/11/18 1453  BP: (!) 149/75  Pulse: 84  Resp: 16  Temp: 98 F (36.7 C)  TempSrc: Oral  SpO2: 97%  Weight: 198 lb (89.8 kg)  Height: 5\' 5"  (1.651 m)     Physical Exam  General appearance: alert, well developed, well  nourished, cooperative and in no distress Head: Normocephalic, without obvious abnormality, atraumatic Respiratory: Respirations even and unlabored, normal respiratory rate Extremities: No gross deformities Skin: Skin color, texture, turgor normal. No rashes seen  Psych: Appropriate mood and affect. Neurologic: Mental status: Alert, oriented to person, place, and time, thought content appropriate.     Assessment & Plan:     1. Mass of pelvis Incidental finding on recent CT scan done for evaluation of kidney stones.  - Ambulatory referral to Gynecology  2. History of kidney stones         Lelon Huh, MD  [facility]

## 2018-02-11 ENCOUNTER — Encounter: Payer: Self-pay | Admitting: Family Medicine

## 2018-02-11 ENCOUNTER — Other Ambulatory Visit: Payer: Self-pay | Admitting: Family Medicine

## 2018-02-11 ENCOUNTER — Ambulatory Visit (INDEPENDENT_AMBULATORY_CARE_PROVIDER_SITE_OTHER): Payer: Medicare HMO | Admitting: Family Medicine

## 2018-02-11 VITALS — BP 149/75 | HR 84 | Temp 98.0°F | Resp 16 | Ht 65.0 in | Wt 198.0 lb

## 2018-02-11 DIAGNOSIS — R1900 Intra-abdominal and pelvic swelling, mass and lump, unspecified site: Secondary | ICD-10-CM

## 2018-02-11 DIAGNOSIS — M7550 Bursitis of unspecified shoulder: Secondary | ICD-10-CM

## 2018-02-11 DIAGNOSIS — Z87442 Personal history of urinary calculi: Secondary | ICD-10-CM | POA: Insufficient documentation

## 2018-02-13 ENCOUNTER — Other Ambulatory Visit: Payer: Self-pay | Admitting: Family Medicine

## 2018-02-13 DIAGNOSIS — M7550 Bursitis of unspecified shoulder: Secondary | ICD-10-CM

## 2018-02-13 MED ORDER — NAPROXEN 500 MG PO TABS: 500.0000 mg | ORAL_TABLET | Freq: Two times a day (BID) | ORAL | 2 refills | Status: DC

## 2018-02-13 NOTE — Telephone Encounter (Signed)
Pt needs a refill on the Naproxin 500mg   [place]  Pt's CB# [phone]  Thanks Con Memos

## 2018-02-26 ENCOUNTER — Encounter: Payer: Self-pay | Admitting: Obstetrics and Gynecology

## 2018-02-26 ENCOUNTER — Ambulatory Visit (INDEPENDENT_AMBULATORY_CARE_PROVIDER_SITE_OTHER): Payer: Medicare HMO | Admitting: Obstetrics and Gynecology

## 2018-02-26 ENCOUNTER — Other Ambulatory Visit (HOSPITAL_COMMUNITY)
Admission: RE | Admit: 2018-02-26 | Discharge: 2018-02-26 | Disposition: A | Discharge status: Home / Self Care | Payer: Medicare HMO | Source: Ambulatory Visit | Attending: Obstetrics and Gynecology | Admitting: Obstetrics and Gynecology

## 2018-02-26 VITALS — BP 160/88 | HR 70 | Ht 65.5 in | Wt 197.0 lb

## 2018-02-26 DIAGNOSIS — N838 Other noninflammatory disorders of ovary, fallopian tube and broad ligament: Secondary | ICD-10-CM

## 2018-02-26 DIAGNOSIS — R1900 Intra-abdominal and pelvic swelling, mass and lump, unspecified site: Secondary | ICD-10-CM | POA: Diagnosis not present

## 2018-02-26 DIAGNOSIS — N839 Noninflammatory disorder of ovary, fallopian tube and broad ligament, unspecified: Secondary | ICD-10-CM | POA: Diagnosis not present

## 2018-02-26 DIAGNOSIS — Z124 Encounter for screening for malignant neoplasm of cervix: Secondary | ICD-10-CM | POA: Insufficient documentation
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
## 2018-02-27 LAB — OVARIAN MALIGNANCY RISK-ROMA
Cancer Antigen (CA) 125: 43.4 U/mL — ABNORMAL HIGH (ref 0.0–38.1)
HE4: 56 pmol/L (ref 0.0–96.5)
Postmenopausal ROMA: 2.42
Premenopausal ROMA: 1.01

## 2018-02-27 LAB — POSTMENOPAUSAL INTERP: LOW

## 2018-02-27 LAB — PREMENOPAUSAL INTERP: LOW

## 2018-02-28 LAB — CYTOLOGY - PAP
Diagnosis: NEGATIVE
HPV (WINDOPATH): NOT DETECTED

## 2018-03-13 ENCOUNTER — Ambulatory Visit (INDEPENDENT_AMBULATORY_CARE_PROVIDER_SITE_OTHER): Payer: Medicare HMO

## 2018-03-13 ENCOUNTER — Encounter: Payer: Self-pay | Admitting: Obstetrics and Gynecology

## 2018-03-13 ENCOUNTER — Ambulatory Visit (INDEPENDENT_AMBULATORY_CARE_PROVIDER_SITE_OTHER): Payer: Medicare HMO | Admitting: Obstetrics and Gynecology

## 2018-03-13 VITALS — BP 136/76 | HR 73 | Wt 196.0 lb

## 2018-03-13 DIAGNOSIS — D251 Intramural leiomyoma of uterus: Secondary | ICD-10-CM

## 2018-03-13 DIAGNOSIS — R1900 Intra-abdominal and pelvic swelling, mass and lump, unspecified site: Secondary | ICD-10-CM
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
## 2018-05-29 DIAGNOSIS — H40003 Preglaucoma, unspecified, bilateral: Secondary | ICD-10-CM | POA: Diagnosis not present

## 2018-06-18 ENCOUNTER — Telehealth: Payer: Self-pay

## 2018-06-18 NOTE — Telephone Encounter (Signed)
She can have a same-day appointment on Friday or Monday if she wants to come in sooner.

## 2018-06-18 NOTE — Telephone Encounter (Signed)
Patient called office with complaints of pain in the right shoulder radiating down into her right arm. Patient states that she thought it was arthritic pain but has been occurring for a month now and is constant. Patient describes pain as sharp and shooting she has been taking otc Tylenol with no relief. Patient requested appt to see you to evaluate, the earliest appt I saw on your schedule that was not a same day appt was on 06/26/18. Please advise if patient needs to be seen sooner?Alexis [name]

## 2018-06-18 NOTE — Telephone Encounter (Signed)
Made apt for 06/20/2018 10:40  Thanks,   -Mickel Baas

## 2018-06-20 ENCOUNTER — Encounter: Payer: Self-pay | Admitting: Family Medicine

## 2018-06-20 ENCOUNTER — Ambulatory Visit (INDEPENDENT_AMBULATORY_CARE_PROVIDER_SITE_OTHER): Payer: Medicare HMO | Admitting: Family Medicine

## 2018-06-20 VITALS — BP 142/70 | HR 72 | Temp 98.1°F | Resp 16 | Wt 194.0 lb

## 2018-06-20 DIAGNOSIS — M7551 Bursitis of right shoulder: Secondary | ICD-10-CM

## 2018-06-20 MED ORDER — PREDNISONE 10 MG PO TABS: ORAL_TABLET | ORAL | 0 refills | Status: AC

## 2018-06-20 NOTE — Progress Notes (Signed)
Patient: Alexis [name] Female    DOB: [birth year]   62 y.o.   MRN: [id number] Visit Date: 06/20/2018  Today's Provider: Lelon Huh, MD   Chief Complaint  Patient presents with  . Shoulder Pain   Subjective:    Shoulder Pain   The pain is present in the right shoulder. This is a recurrent problem. The current episode started more than 1 month ago. There has been no history of extremity trauma. The problem occurs constantly. The problem has been gradually worsening. The quality of the pain is described as aching and sharp. The pain is moderate (can be severe). Associated symptoms include an inability to bear weight, a limited range of motion and stiffness. Pertinent negatives include no fever or numbness. She has tried acetaminophen for the symptoms. The treatment provided no relief.  Pain has been getting progressively worse over the last several days, keeping her up at night.     No Known Allergies   Current Outpatient Medications:  .  Acetaminophen (TYLENOL PO), Take by mouth as needed., Disp: , Rfl:  .  aspirin 81 MG tablet, Take 81 mg by mouth daily., Disp: , Rfl:  .  atorvastatin (LIPITOR) 20 MG tablet, Take 1 tablet (20 mg total) by mouth daily., Disp: 90 tablet, Rfl: 4 .  Butalbital-APAP-Caffeine (CAPACET) 50-325-40 MG capsule, Take 1 capsule by mouth every 4 (four) hours as needed for pain., Disp: 20 capsule, Rfl: 1 .  gabapentin (NEURONTIN) 300 MG capsule, TAKE ONE CAPSULE BY MOUTH TWICE DAILY., Disp: 180 capsule, Rfl: 4 .  lubiprostone (AMITIZA) 24 MCG capsule, Take 24 mcg by mouth 2 (two) times daily with a meal., Disp: , Rfl:  .  naproxen (NAPROSYN) 500 MG tablet, Take 1 tablet (500 mg total) by mouth 2 (two) times daily with a meal., Disp: 60 tablet, Rfl: 2 .  oxybutynin (DITROPAN) 5 MG tablet, Take 5 mg by mouth 2 (two) times daily., Disp: , Rfl:  .  topiramate (TOPAMAX) 100 MG tablet, Take 1 tablet (100 mg total) by mouth 2 (two) times daily., Disp: 60 tablet, Rfl:  6 .  triamterene-hydrochlorothiazide (MAXZIDE) 75-50 MG tablet, Take 1 tablet by mouth daily., Disp: 39 tablet, Rfl: 5 .  Vitamin D, Ergocalciferol, (DRISDOL) 50000 units CAPS capsule, Take 1 capsule (50,000 Units total) by mouth every 7 (seven) days., Disp: 12 capsule, Rfl: 4  Review of Systems  Constitutional: Negative for activity change, appetite change, chills, diaphoresis, fatigue, fever and unexpected weight change.  Cardiovascular: Negative for chest pain, palpitations and leg swelling.  Musculoskeletal: Positive for arthralgias, joint swelling, myalgias and stiffness. Negative for back pain, gait problem, neck pain and neck stiffness.  Skin: Negative.   Neurological: Negative for dizziness, tremors, weakness, light-headedness, numbness and headaches.    Social History   Tobacco Use  . Smoking status: Never Smoker  . Smokeless tobacco: Never Used  Substance Use Topics  . Alcohol use: No   Objective:   BP (!) 142/70 (BP Location: Left Arm, Patient Position: Sitting, Cuff Size: Normal)   Pulse 72   Temp 98.1 F (36.7 C)   Resp 16   Wt 194 lb (88 kg)   BMI 31.79 kg/m  Vitals:   06/20/18 1041  BP: (!) 142/70  Pulse: 72  Resp: 16  Temp: 98.1 F (36.7 C)  Weight: 194 lb (88 kg)     Physical Exam  General appearance: alert, well developed, well nourished, cooperative and in no distress Head:  Normocephalic, without obvious abnormality, atraumatic Respiratory: Respirations even and unlabored, normal respiratory rate Extremities: Positive impingement sign right. Unable to abduct > 90 degrees. Tender over acromium.     Assessment & Plan:     1. Subacromial bursitis of right shoulder joint Offered steroid injections but she prefers oral medications. Try prednisone taper. Recommended orthopedic referral if not quickly improving with prednisone.  - predniSONE (DELTASONE) 10 MG tablet; 6 tablets for 2 days, then 5 for 2 days, then 4 for 2 days, then 3 for 2 days, then 2  for 2 days, then 1 for 2 days.  Dispense: 42 tablet; Refill: 0       Lelon Huh, MD  [facility]

## 2018-06-20 NOTE — Patient Instructions (Addendum)
   Apply ice pack to shoulder twice a day for the next week   Call back for orthopedic referral if not much better in a week  . You should get a flu shot when you are finished with prednisone

## 2018-06-26 ENCOUNTER — Ambulatory Visit: Payer: Self-pay | Admitting: Family Medicine

## 2018-06-27 ENCOUNTER — Other Ambulatory Visit: Payer: Self-pay | Admitting: Family Medicine

## 2018-06-27 DIAGNOSIS — M542 Cervicalgia: Secondary | ICD-10-CM

## 2018-07-04 ENCOUNTER — Other Ambulatory Visit: Payer: Self-pay | Admitting: Family Medicine

## 2018-07-04 DIAGNOSIS — R1013 Epigastric pain: Secondary | ICD-10-CM

## 2018-09-10 ENCOUNTER — Telehealth: Payer: Self-pay | Admitting: Family Medicine

## 2018-09-10 NOTE — Telephone Encounter (Signed)
Pt had to cancel her AWV appt on Mon 09-14-18. Please call her back at [phone] to reschedule.  Thanks, [employer]

## 2018-09-15 ENCOUNTER — Ambulatory Visit: Payer: Self-pay

## 2018-09-16 NOTE — Telephone Encounter (Signed)
Scheduled AWV for 09/24/18 @ 2:40 PM.  -MM

## 2018-09-24 ENCOUNTER — Ambulatory Visit (INDEPENDENT_AMBULATORY_CARE_PROVIDER_SITE_OTHER): Payer: Medicare HMO

## 2018-09-24 ENCOUNTER — Other Ambulatory Visit: Payer: Self-pay

## 2018-09-24 VITALS — BP 148/80 | HR 68 | Temp 97.9°F | Ht 66.0 in | Wt 197.0 lb

## 2018-09-24 DIAGNOSIS — Z0000 Encounter for general adult medical examination without abnormal findings: Secondary | ICD-10-CM | POA: Diagnosis not present

## 2018-09-24 NOTE — Patient Instructions (Addendum)
Alexis [name] , Thank you for taking time to come for your Medicare Wellness Visit. I appreciate your ongoing commitment to your health goals. Please review the following plan we discussed and let me know if I can assist you in the future.   Screening recommendations/referrals: Colonoscopy: Up to date, due 02/2020 Mammogram: Up to date, due 12/2019 Bone Density: Not required until age 65. Recommended yearly ophthalmology/optometry visit for glaucoma screening and checkup Recommended yearly dental visit for hygiene and checkup  Vaccinations: Influenza vaccine: Pt declines today.  Pneumococcal vaccine: Not required until age 51. Tdap vaccine: Pt declines today.  Shingles vaccine: Pt declines today.     Advanced directives: Advance directive discussed with you today. Even though you declined this today please call our office should you change your mind and we can give you the proper paperwork for you to fill out.  Conditions/risks identified: Obesity- Continue increasing water intake to 6-8 8 oz glasses a day and eating 3 small meals a day with 2 healthy snacks in between.  Next appointment: 10/13/18 @ 2:00 PM with Dr Caryn Section.    Preventive Care 11 Years and Older, Female Preventive care refers to lifestyle choices and visits with your health care provider that can promote health and wellness. What does preventive care include?  A yearly physical exam. This is also called an annual well check.  Dental exams once or twice a year.  Routine eye exams. Ask your health care provider how often you should have your eyes checked.  Personal lifestyle choices, including:  Daily care of your teeth and gums.  Regular physical activity.  Eating a healthy diet.  Avoiding tobacco and drug use.  Limiting alcohol use.  Practicing safe sex.  Taking low-dose aspirin every day.  Taking vitamin and mineral supplements as recommended by your health care provider. What happens during an annual well  check? The services and screenings done by your health care provider during your annual well check will depend on your age, overall health, lifestyle risk factors, and family history of disease. Counseling  Your health care provider may ask you questions about your:  Alcohol use.  Tobacco use.  Drug use.  Emotional well-being.  Home and relationship well-being.  Sexual activity.  Eating habits.  History of falls.  Memory and ability to understand (cognition).  Work and work Statistician.  Reproductive health. Screening  You may have the following tests or measurements:  Height, weight, and BMI.  Blood pressure.  Lipid and cholesterol levels. These may be checked every 5 years, or more frequently if you are over 53 years old.  Skin check.  Lung cancer screening. You may have this screening every year starting at age 41 if you have a 30-pack-year history of smoking and currently smoke or have quit within the past 15 years.  Fecal occult blood test (FOBT) of the stool. You may have this test every year starting at age 80.  Flexible sigmoidoscopy or colonoscopy. You may have a sigmoidoscopy every 5 years or a colonoscopy every 10 years starting at age 42.  Hepatitis C blood test.  Hepatitis B blood test.  Sexually transmitted disease (STD) testing.  Diabetes screening. This is done by checking your blood sugar (glucose) after you have not eaten for a while (fasting). You may have this done every 1-3 years.  Bone density scan. This is done to screen for osteoporosis. You may have this done starting at age 72.  Mammogram. This may be done every 1-2 years.  Talk to your health care provider about how often you should have regular mammograms. Talk with your health care provider about your test results, treatment options, and if necessary, the need for more tests. Vaccines  Your health care provider may recommend certain vaccines, such as:  Influenza vaccine. This is  recommended every year.  Tetanus, diphtheria, and acellular pertussis (Tdap, Td) vaccine. You may need a Td booster every 10 years.  Zoster vaccine. You may need this after age 6.  Pneumococcal 13-valent conjugate (PCV13) vaccine. One dose is recommended after age 54.  Pneumococcal polysaccharide (PPSV23) vaccine. One dose is recommended after age 28. Talk to your health care provider about which screenings and vaccines you need and how often you need them. This information is not intended to replace advice given to you by your health care provider. Make sure you discuss any questions you have with your health care provider. Document Released: 07/29/2015 Document Revised: 03/21/2016 Document Reviewed: 05/03/2015 Elsevier Interactive Patient Education  2017 [place] Prevention in the Home Falls can cause injuries. They can happen to people of all ages. There are many things you can do to make your home safe and to help prevent falls. What can I do on the outside of my home?  Regularly fix the edges of walkways and driveways and fix any cracks.  Remove anything that might make you trip as you walk through a door, such as a raised step or threshold.  Trim any bushes or trees on the path to your home.  Use bright outdoor lighting.  Clear any walking paths of anything that might make someone trip, such as rocks or tools.  Regularly check to see if handrails are loose or broken. Make sure that both sides of any steps have handrails.  Any raised decks and porches should have guardrails on the edges.  Have any leaves, snow, or ice cleared regularly.  Use sand or salt on walking paths during winter.  Clean up any spills in your garage right away. This includes oil or grease spills. What can I do in the bathroom?  Use night lights.  Install grab bars by the toilet and in the tub and shower. Do not use towel bars as grab bars.  Use non-skid mats or decals in the tub or  shower.  If you need to sit down in the shower, use a plastic, non-slip stool.  Keep the floor dry. Clean up any water that spills on the floor as soon as it happens.  Remove soap buildup in the tub or shower regularly.  Attach bath mats securely with double-sided non-slip rug tape.  Do not have throw rugs and other things on the floor that can make you trip. What can I do in the bedroom?  Use night lights.  Make sure that you have a light by your bed that is easy to reach.  Do not use any sheets or blankets that are too big for your bed. They should not hang down onto the floor.  Have a firm chair that has side arms. You can use this for support while you get dressed.  Do not have throw rugs and other things on the floor that can make you trip. What can I do in the kitchen?  Clean up any spills right away.  Avoid walking on wet floors.  Keep items that you use a lot in easy-to-reach places.  If you need to reach something above you, use a strong step stool  that has a grab bar.  Keep electrical cords out of the way.  Do not use floor polish or wax that makes floors slippery. If you must use wax, use non-skid floor wax.  Do not have throw rugs and other things on the floor that can make you trip. What can I do with my stairs?  Do not leave any items on the stairs.  Make sure that there are handrails on both sides of the stairs and use them. Fix handrails that are broken or loose. Make sure that handrails are as long as the stairways.  Check any carpeting to make sure that it is firmly attached to the stairs. Fix any carpet that is loose or worn.  Avoid having throw rugs at the top or bottom of the stairs. If you do have throw rugs, attach them to the floor with carpet tape.  Make sure that you have a light switch at the top of the stairs and the bottom of the stairs. If you do not have them, ask someone to add them for you. What else can I do to help prevent falls?   Wear shoes that:  Do not have high heels.  Have rubber bottoms.  Are comfortable and fit you well.  Are closed at the toe. Do not wear sandals.  If you use a stepladder:  Make sure that it is fully opened. Do not climb a closed stepladder.  Make sure that both sides of the stepladder are locked into place.  Ask someone to hold it for you, if possible.  Clearly mark and make sure that you can see:  Any grab bars or handrails.  First and last steps.  Where the edge of each step is.  Use tools that help you move around (mobility aids) if they are needed. These include:  Canes.  Walkers.  Scooters.  Crutches.  Turn on the lights when you go into a dark area. Replace any light bulbs as soon as they burn out.  Set up your furniture so you have a clear path. Avoid moving your furniture around.  If any of your floors are uneven, fix them.  If there are any pets around you, be aware of where they are.  Review your medicines with your doctor. Some medicines can make you feel dizzy. This can increase your chance of falling. Ask your doctor what other things that you can do to help prevent falls. This information is not intended to replace advice given to you by your health care provider. Make sure you discuss any questions you have with your health care provider. Document Released: 04/28/2009 Document Revised: 12/08/2015 Document Reviewed: 08/06/2014 Elsevier Interactive Patient Education  2017 [employer].
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
## 2018-10-13 ENCOUNTER — Encounter: Payer: Medicare HMO | Admitting: Family Medicine

## 2018-12-22 ENCOUNTER — Ambulatory Visit: Payer: Self-pay | Admitting: Family Medicine

## 2019-01-09 ENCOUNTER — Telehealth: Payer: Self-pay | Admitting: Family Medicine

## 2019-01-09 NOTE — Chronic Care Management (AMB) (Signed)
°  Chronic Care Management   Outreach Note  01/09/2019 Name: Alexis [name] MRN: [id number] DOB: August 27, 1955  Referred by: Birdie Sons, MD Reason for referral : Chronic Care Management (Initial CCM outreach was unsuccessful. )   An unsuccessful telephone outreach was attempted today. The patient was referred to the case management team by for assistance with chronic care management and care coordination.   Follow Up Plan: A HIPPA compliant phone message was left for the patient providing contact information and requesting a return call.  The care management team will reach out to the patient again over the next 7 days.  If patient returns call to provider office, please advise to call [place] at [place]  [email or web address]   ??[phone]

## 2019-01-09 NOTE — Chronic Care Management (AMB) (Signed)
Chronic Care Management   Note  01/09/2019 Name: [name] MRN: [id number] DOB: 1955/08/09  Alexis [name] is a 63 y.o. year old female who is a primary care patient of Fisher, Kirstie Peri, MD. I reached out to Alexis [name] by phone today in response to a referral sent by Ms. Georgina Peer I [place]'s health plan.    Ms. Askari was given information about Chronic Care Management services today including:  1. CCM service includes personalized support from designated clinical staff supervised by her physician, including individualized plan of care and coordination with other care providers 2. 24/7 contact phone numbers for assistance for urgent and routine care needs. 3. Service will only be billed when office clinical staff spend 20 minutes or more in a month to coordinate care. 4. Only one practitioner may furnish and bill the service in a calendar month. 5. The patient may stop CCM services at any time (effective at the end of the month) by phone call to the office staff. 6. The patient will be responsible for cost sharing (co-pay) of up to 20% of the service fee (after annual deductible is met).  Patient agreed to services and verbal consent obtained.   Follow up plan: Telephone appointment with CCM team member scheduled for: 02/03/2019  [place]  [email or web address]   ??[phone]

## 2019-02-03 ENCOUNTER — Other Ambulatory Visit: Payer: Self-pay

## 2019-02-03 ENCOUNTER — Ambulatory Visit: Payer: Medicare HMO

## 2019-02-03 DIAGNOSIS — R413 Other amnesia: Secondary | ICD-10-CM

## 2019-02-03 DIAGNOSIS — E7800 Pure hypercholesterolemia, unspecified: Secondary | ICD-10-CM

## 2019-02-03 DIAGNOSIS — I10 Essential (primary) hypertension: Secondary | ICD-10-CM

## 2019-02-03 NOTE — Chronic Care Management (AMB) (Signed)
Chronic Care Management   Initial Visit Note  02/04/2019 Name: Alexis [name] MRN: [id number] DOB: 1956/06/03  Subjective: "I'm not sure where that medicine bottle is"  Objective:  Lab Results  Component Value Date   HGBA1C 5.9 (H) 12/26/2017   Lab Results  Component Value Date   LDLCALC 155 (H) 12/26/2017   CREATININE 0.83 12/26/2017   BP Readings from Last 3 Encounters:  09/24/18 (!) 148/80  06/20/18 (!) 142/70  03/13/18 65/76   Lab Results  Component Value Date   CHOL 242 (H) 12/26/2017   CHOL 199 05/23/2016   CHOL 222 (H) 01/04/2015   Lab Results  Component Value Date   HDL 67 12/26/2017   HDL 69 05/23/2016   HDL 74 01/04/2015   Lab Results  Component Value Date   LDLCALC 155 (H) 12/26/2017   LDLCALC 118 (H) 05/23/2016   LDLCALC 133 (H) 01/04/2015   Lab Results  Component Value Date   TRIG 98 12/26/2017   TRIG 58 05/23/2016   TRIG 76 01/04/2015   Lab Results  Component Value Date   CHOLHDL 3.6 12/26/2017   CHOLHDL 2.9 05/23/2016   CHOLHDL 3.0 01/04/2015   No results found for: LDLDIRECT  Assessment: [name] is a 63 year old female patient of Dr. Lelon Huh who was referred to the chronic case management team by her health plan. She was consented to services and initial telephone assessment with RN CM scheduled for today. She has a history of but not limited to CVA, OSA, Hypercholesteremia, Memory difficulty, and depression.  Review of patient status, including review of consultants reports, relevant laboratory and other test results, and collaboration with appropriate care team members and the patient's provider was performed as part of comprehensive patient evaluation and provision of chronic care management services.     Goals Addressed            This Visit's Progress   . "I don't know why I can't find that medicine' (pt-stated)       Medication reconciliation completed today. [name] apparently keeps her medication bottles in  different places in her home. She was unsure what she was taking and reports knowing she "put that pill in my box but I don't know where the bottle is". Daughter concerned that patient is not managing her medications correctly. Daughter lives in [place] and patients sister lives in [place]. Patient lives alone. Daughter and patient feel it would be a good idea for pill pack to simplify patients medications and decrease potential error with administration.     Current Barriers:  . Lacks caregiver support.   Nurse Case Manager Clinical Goal(s):  Marland Kitchen Over the next 14 days, patient will work with [facility] Pharmacist to determine if pill pack would be a better option for patient secondary to memory deficit and lack of in home caregiver support  Interventions:  . Discussed possibility of pill pack with daughter and patient . Referral to [place]  Patient Self Care Activities:  . Self administers medications as prescribed . Attends all scheduled provider appointments . Calls pharmacy for medication refills . Attends church or other social activities . Performs ADL's independently . Performs IADL's independently . Calls provider office for new concerns or questions  Initial goal documentation     . I really need glasses but they cost too much (pt-stated)       [name] states she has a new prescription for eye glasses but she  never had filled because of cost. She did not enquire about any [employer] eye wear benefits. She assumed her insurance would not pay therefore she did not pursue. Daughter unaware and willing to purchase glasses for patient.    Current Barriers:  Marland Kitchen Knowledge Deficits related to [facility] eye care benefit  Nurse Case Manager Clinical Goal(s):  Marland Kitchen Over the next 30 days, patient will work with daughter to obtain new glasses utilizing human[employer] medicare coverage [employer]  Interventions:  . Provided education to patient/daughter re: human[employer] coverage for eye care  and glasses  Patient Self Care Activities:  . Alexis [name] medicare insurance coverage to obtain new glasses  Initial goal documentation     . per daughter "I really don't know what benefits mom has to help keep her healthy" (pt-stated)       Patient asked RN CM if she would contact daughter to review patient's [employer] benefits. Daughter extremely thankful for call. Daughter states she will utilize benefits for patient including transportation, OTC pharmacy for BP cuff, hearing aide, and eyeglasses.    Current Barriers:  Marland Kitchen Knowledge Deficits related to knowledge of [employer] Medicare benefits  Nurse Case Manager Clinical Goal(s):  Marland Kitchen Over the next 14 days, patient/daughter will explore [employer] Medicare benefits including OTC Pharmacy benefits, hearing aid benefits, transportation benefits, and eye care benefits  Interventions:  . Provided daughter with contact information for [employer] OTC benefits [phone]) and discussed patients HMO plan 1 50.00/quarter benefit . Provided daughter with contact information for [facility] transportation ([employer] [id number]) and encouraged daugter to assist patient with setting up account . Discussed with daughter patients hearing aid coverage of 699.00/ear and encouraged to utilize . Provided daughter with number for [facility] eyecare coverage for eyeglasses coverage and retailer . Discussed with daughter benefits of chronic case management program and provided patient and daughter with CCM RN CM contact information  Patient Self Care Activities:  . Self administers medications as prescribed . Attends all scheduled provider appointments . Calls pharmacy for medication refills . Attends church or other social activities . Performs ADL's independently . Performs IADL's independently . Calls provider office for new concerns or questions  Initial goal documentation         Follow up plan:  Telephone follow up appointment with care management  team member scheduled for: 2 weeks   [name] E. Rollene Rotunda, RN, BSN Nurse Care Coordinator [facility] Practice/[facility] Care Management [phone]

## 2019-02-04 ENCOUNTER — Other Ambulatory Visit: Payer: Self-pay | Admitting: Family Medicine

## 2019-02-04 DIAGNOSIS — M7550 Bursitis of unspecified shoulder: Secondary | ICD-10-CM

## 2019-02-04 NOTE — Patient Instructions (Addendum)
Thank you allowing the Chronic Care Management Team to be a part of your care! It was a pleasure speaking with you today!   Please utilize the benefits below in your plan of care for [employer] OTC, transportation, eyeglasses, hearing aides  Continue to take all your medication as prescribed. I will have Ruben Reason pharmacist call you and daughter to discuss pill pack.  CCM (Chronic Care Management) Team   Trish Fountain RN, BSN Nurse Care Coordinator  [phone]  Ruben Reason PharmD  Clinical Pharmacist  [phone]   Elliot Gurney, LCSW Clinical Social Worker [phone]  Goals Addressed            This Visit's Progress   . "I don't know why I can't find that medicine' (pt-stated)       Current Barriers:  . Lacks caregiver support.   Nurse Case Manager Clinical Goal(s):  Marland Kitchen Over the next 14 days, patient will work with [facility] Pharmacist to determine if pill pack would be a better option for patient secondary to memory deficit and lack of in home caregiver support  Interventions:  . Discussed possibility of pill pack with daughter and patient . Referral to [place]  Patient Self Care Activities:  . Self administers medications as prescribed . Attends all scheduled provider appointments . Calls pharmacy for medication refills . Attends church or other social activities . Performs ADL's independently . Performs IADL's independently . Calls provider office for new concerns or questions  Initial goal documentation     . I really need glasses but they cost too much (pt-stated)       Current Barriers:  Marland Kitchen Knowledge Deficits related to [facility] eye care benefit  Nurse Case Manager Clinical Goal(s):  Marland Kitchen Over the next 30 days, patient will work with daughter to obtain new glasses utilizing [employer] medicare coverage [employer]  Interventions:  . Provided education to patient/daughter re: [employer] coverage for eye care and glasses  Patient Self  Care Activities:  . [name] medicare insurance coverage to obtain new glasses  Initial goal documentation     . per daughter "I really don't know what benefits mom has to help keep her healthy" (pt-stated)       Current Barriers:  Marland Kitchen Knowledge Deficits related to knowledge of [employer] Medicare benefits  Nurse Case Manager Clinical Goal(s):  Marland Kitchen Over the next 14 days, patient/daughter will explore [employer] Medicare benefits including OTC Pharmacy benefits, hearing aid benefits, transportation benefits, and eye care benefits  Interventions:  . Provided daughter with contact information for [employer] OTC benefits [phone]) and discussed patients HMO plan 1 50.00/quarter benefit . Provided daughter with contact information for [facility] transportation ([employer] [phone]) and encouraged daugter to assist patient with setting up account . Discussed with daughter patients hearing aid coverage of 699.00/ear and encouraged to utilize . Provided daughter with number for [facility] eyecare coverage for eyeglasses coverage and retailer . Discussed with daughter benefits of chronic case management program and provided patient and daughter with CCM RN CM contact information  Patient Self Care Activities:  . Self administers medications as prescribed . Attends all scheduled provider appointments . Calls pharmacy for medication refills . Attends church or other social activities . Performs ADL's independently . Performs IADL's independently . Calls provider office for new concerns or questions  Initial goal documentation        Print copy of patient instructions provided.   Telephone follow up appointment with care management team member scheduled  for: two weeks  SYMPTOMS OF A STROKE   You have any symptoms of stroke. "BE FAST" is an easy way to remember the main warning signs: ? B - Balance. Signs are dizziness, sudden trouble walking, or loss of balance. ? E - Eyes. Signs are  trouble seeing or a sudden change in how you see. ? F - Face. Signs are sudden weakness or loss of feeling of the face, or the face or eyelid drooping on one side. ? A - Arms. Signs are weakness or loss of feeling in an arm. This happens suddenly and usually on one side of the body. ? S - Speech. Signs are sudden trouble speaking, slurred speech, or trouble understanding what people say. ? T - Time. Time to call emergency services. Write down what time symptoms started.  You have other signs of stroke, such as: ? A sudden, very bad headache with no known cause. ? Feeling sick to your stomach (nausea). ? Throwing up (vomiting). ? Jerky movements you cannot control (seizure).  SYMPTOMS OF A HEART ATTACK  What are the signs or symptoms? Symptoms of this condition include:  Chest pain. It may feel like: ? Crushing or squeezing. ? Tightness, pressure, fullness, or heaviness.  Pain in the arm, neck, jaw, back, or upper body.  Shortness of breath.  Heartburn.  Indigestion.  Nausea.  Cold sweats.  Feeling tired.  Sudden lightheadedness.

## 2019-02-06 ENCOUNTER — Ambulatory Visit: Payer: Self-pay | Admitting: Pharmacist

## 2019-02-06 DIAGNOSIS — E7800 Pure hypercholesterolemia, unspecified: Secondary | ICD-10-CM

## 2019-02-06 DIAGNOSIS — R413 Other amnesia: Secondary | ICD-10-CM

## 2019-02-06 DIAGNOSIS — I10 Essential (primary) hypertension: Secondary | ICD-10-CM

## 2019-02-06 NOTE — Chronic Care Management (AMB) (Signed)
  Chronic Care Management   Note  02/06/2019 Name: Alexis [name] MRN: [id number] DOB: Aug 20, 1955  63 y.o. year old female referred to Chronic Care Management clinical pharmacist by [facility] Trish Fountain via internal referral for pill packs/medication adherence. Chronic conditions include HTN, HLD, depression. Last office visit with Birdie Sons, MD was 06/20/18. Last visit with CCM (initial encounter with Trish Fountain) was 02/03/19.    Was unable to reach patient via telephone today and have left HIPAA compliant voicemail asking patient to return my call. (unsuccessful outreach #1).  Follow up plan: A HIPPA compliant phone message was left for the patient providing contact information and requesting a return call.  The care management team will reach out to the patient again over the next 5-7 days.   Ruben Reason, PharmD Clinical Pharmacist [place] [phone]

## 2019-02-09 ENCOUNTER — Ambulatory Visit: Payer: Self-pay | Admitting: Pharmacist

## 2019-02-09 DIAGNOSIS — E7800 Pure hypercholesterolemia, unspecified: Secondary | ICD-10-CM

## 2019-02-09 DIAGNOSIS — R413 Other amnesia: Secondary | ICD-10-CM

## 2019-02-09 DIAGNOSIS — I10 Essential (primary) hypertension: Secondary | ICD-10-CM

## 2019-02-09 NOTE — Patient Instructions (Signed)
Goals Addressed            This Visit's Progress   . COMPLETED: "I don't know why I can't find that medicine' (pt-stated)       Current Barriers:  . Lacks caregiver support.   Nurse Case Manager Clinical Goal(s):  Marland Kitchen Over the next 14 days, patient will work with [facility] Pharmacist to determine if pill pack would be a better option for patient secondary to memory deficit and lack of in home caregiver support  Interventions:  . Discussed possibility of pill pack with daughter and patient . Referral to [place]  Patient Self Care Activities:  . Self administers medications as prescribed . Attends all scheduled provider appointments . Calls pharmacy for medication refills . Attends church or other social activities . Performs ADL's independently . Performs IADL's independently . Calls provider office for new concerns or questions  Initial goal documentation     . I would like to get pill packs (pt-stated)       "I don't want to take so many pills"   Current Barriers:  . High pill burden . Memory deficit . Lack of caregiver support (daughter lives in [place])  Pharmacist Clinical Goal(s): Over the next 90 days, Ms. Kersting will be adherent to all medications as evidenced by patient report and pharmacy fill history  Over the next 30 days, Ms. Seeley will successfully transition to using pill packaging at retail pharmacy to help her keep her medications organized.   Interventions: . Counseling on compliance packaging  . Setting up compliance packaing at area pharmacy  o Ms Astarita [facility] on [address] [facility] [address] [facility]  Patient Self Care Activities:  . Take all medications as prescribed  Initial goal documentation        The patient verbalized understanding of instructions provided today and declined a print copy of patient instruction materials.

## 2019-02-09 NOTE — Chronic Care Management (AMB) (Signed)
Chronic Care Management   Note  02/09/2019 Name: Alexis [name] MRN: [id number] DOB: 06-25-1956  Subjective:   Alexis [name] is a 63 year old female patient of Dr. Lelon Huh. CCM clinical pharmacist received internal referral from [employer] for medication adherence. Today, Alexis [name] was returning CCM pharmacist prior call, along with her daughter, Alexis [name]. HIPAA identifiers verified.   Objective: Lab Results  Component Value Date   CREATININE 0.83 12/26/2017   CREATININE 0.79 07/24/2016   CREATININE 0.91 05/23/2016    Lab Results  Component Value Date   HGBA1C 5.9 (H) 12/26/2017    Lipid Panel     Component Value Date/Time   CHOL 242 (H) 12/26/2017 1056   TRIG 98 12/26/2017 1056   HDL 67 12/26/2017 1056   CHOLHDL 3.6 12/26/2017 1056   CHOLHDL 2.3 06/02/2012 1108   VLDL 15 06/02/2012 1108   LDLCALC 155 (H) 12/26/2017 1056    BP Readings from Last 3 Encounters:  09/24/18 (!) 148/80  06/20/18 (!) 142/70  03/13/18 136/76    No Known Allergies  Medications Reviewed Today    Reviewed by Benedetto Goad, RN (Registered Nurse) on 02/03/19 at 52  Med List Status: <None>  Medication Order Taking? Sig Documenting Provider Last Dose Status Informant  Acetaminophen (TYLENOL PO) [id number] Yes Take by mouth as needed. [provider] Taking Active   aspirin 81 MG tablet [id number] Yes Take 81 mg by mouth daily. [provider] Taking Active   atorvastatin (LIPITOR) 20 MG tablet [id number] Yes Take 1 tablet (20 mg total) by mouth daily. Birdie Sons, MD Taking Active   Butalbital-APAP-Caffeine [facility]) [phone] MG capsule [id number] No Take 1 capsule by mouth every 4 (four) hours as needed for pain.  Patient not taking: Reported on 02/03/2019   Birdie Sons, MD Not Taking Active   gabapentin (NEURONTIN) 300 MG capsule [id number] Yes TAKE 1 CAPSULE BY MOUTH TWICE DAILY Fisher, Kirstie Peri, MD Taking Active   lubiprostone (AMITIZA) 24 MCG  capsule [id number] Yes Take 24 mcg by mouth daily with breakfast.  [provider] Taking Active   naproxen (NAPROSYN) 500 MG tablet [id number] No Take 1 tablet (500 mg total) by mouth 2 (two) times daily with a meal.  Patient not taking: Reported on 02/03/2019   Birdie Sons, MD Not Taking Active   omeprazole (PRILOSEC) 40 MG capsule [id number] Yes TAKE 1 CAPSULE BY MOUTH ONCE DAILY Birdie Sons, MD Taking Active   oxybutynin (DITROPAN) 5 MG tablet [id number] No Take 5 mg by mouth 2 (two) times daily. [provider] Not Taking Active            Med Note Rollene Rotunda, [place] Feb 03, 2019  1:20 PM) Need to reorder  topiramate (TOPAMAX) 100 MG tablet [id number] No Take 1 tablet (100 mg total) by mouth 2 (two) times daily.  Patient not taking: Reported on 09/24/2018   Birdie Sons, MD Not Taking Active   triamterene-hydrochlorothiazide [facility]) 75-50 MG tablet [id number] Yes Take 1 tablet by mouth daily. Birdie Sons, MD Taking Active   Vitamin D, Ergocalciferol, (DRISDOL) 50000 units CAPS capsule [id number] Yes Take 1 capsule (50,000 Units total) by mouth every 7 (seven) days. Birdie Sons, MD Taking Active            Assessment:    Goals Addressed            This Visit's Progress   .  COMPLETED: "I don't know why I can't find that medicine' (pt-stated)       Current Barriers:  . Lacks caregiver support.   Nurse Case Manager Clinical Goal(s):  Marland Kitchen Over the next 14 days, patient will work with [facility] Pharmacist to determine if pill pack would be a better option for patient secondary to memory deficit and lack of in home caregiver support  Interventions:  . Discussed possibility of pill pack with daughter and patient . Referral to [place]  Patient Self Care Activities:  . Self administers medications as prescribed . Attends all scheduled provider appointments . Calls pharmacy for medication refills . Attends church or other social  activities . Performs ADL's independently . Performs IADL's independently . Calls provider office for new concerns or questions  Initial goal documentation     . I would like to get pill packs (pt-stated)       "I don't want to take so many pills"   Current Barriers:  . High pill burden . Memory deficit . Lack of caregiver support (daughter lives in [place])  Pharmacist Clinical Goal(s): Over the next 90 days, Alexis [name] will be adherent to all medications as evidenced by patient report and pharmacy fill history  Over the next 30 days, Alexis [name] will successfully transition to using pill packaging at retail pharmacy to help her keep her medications organized.   Interventions: . Counseling on compliance packaging  . Setting up compliance packaing at area pharmacy  o [name] [address] [facility]  Patient Self Care Activities:  . Take all medications as prescribed  Initial goal documentation        Plan: [facility] will [facility] medications from other pharmacies (Alexis [name] has about 49 DS of some of her medications from [place]  Dr. Caryn Section [facility]  Follow up: Telephone follow up appointment with care management team member scheduled for: 1 week     Ruben Reason, PharmD Clinical Pharmacist [place] [phone]

## 2019-02-11 ENCOUNTER — Other Ambulatory Visit: Payer: Self-pay

## 2019-02-11 ENCOUNTER — Ambulatory Visit
Admission: RE | Admit: 2019-02-11 | Discharge: 2019-02-11 | Disposition: A | Discharge status: Home / Self Care | Payer: Medicare HMO | Attending: Family Medicine | Admitting: Family Medicine

## 2019-02-11 ENCOUNTER — Encounter: Payer: Self-pay | Admitting: Family Medicine

## 2019-02-11 ENCOUNTER — Ambulatory Visit (INDEPENDENT_AMBULATORY_CARE_PROVIDER_SITE_OTHER): Payer: Medicare HMO | Admitting: Family Medicine

## 2019-02-11 ENCOUNTER — Ambulatory Visit
Admission: RE | Admit: 2019-02-11 | Discharge: 2019-02-11 | Disposition: A | Discharge status: Home / Self Care | Payer: Medicare HMO | Source: Ambulatory Visit | Attending: Family Medicine | Admitting: Family Medicine

## 2019-02-11 ENCOUNTER — Inpatient Hospital Stay: Admit: 2019-02-11 | Payer: Medicare HMO

## 2019-02-11 VITALS — BP 140/82 | HR 60 | Temp 98.6°F | Resp 18 | Ht 66.0 in | Wt 201.0 lb

## 2019-02-11 DIAGNOSIS — M545 Low back pain, unspecified: Secondary | ICD-10-CM

## 2019-02-11 DIAGNOSIS — Z0000 Encounter for general adult medical examination without abnormal findings: Secondary | ICD-10-CM | POA: Diagnosis not present

## 2019-02-11 DIAGNOSIS — Z8249 Family history of ischemic heart disease and other diseases of the circulatory system: Secondary | ICD-10-CM | POA: Diagnosis not present

## 2019-02-11 DIAGNOSIS — E559 Vitamin D deficiency, unspecified: Secondary | ICD-10-CM | POA: Diagnosis not present

## 2019-02-11 DIAGNOSIS — R739 Hyperglycemia, unspecified: Secondary | ICD-10-CM | POA: Diagnosis not present

## 2019-02-11 DIAGNOSIS — R946 Abnormal results of thyroid function studies: Secondary | ICD-10-CM

## 2019-02-11 DIAGNOSIS — M79605 Pain in left leg: Secondary | ICD-10-CM

## 2019-02-11 DIAGNOSIS — I10 Essential (primary) hypertension: Secondary | ICD-10-CM

## 2019-02-11 DIAGNOSIS — E2839 Other primary ovarian failure: Secondary | ICD-10-CM

## 2019-02-11 DIAGNOSIS — E7800 Pure hypercholesterolemia, unspecified: Secondary | ICD-10-CM

## 2019-02-11 DIAGNOSIS — Z23 Encounter for immunization: Secondary | ICD-10-CM | POA: Diagnosis not present

## 2019-02-11 DIAGNOSIS — R1032 Left lower quadrant pain: Secondary | ICD-10-CM
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
## 2019-02-11 NOTE — Patient Instructions (Addendum)
.   Please review the attached list of medications and notify my office if there are any errors.    We will have flu vaccines available after Labor Day. Please go to your pharmacy or call the office in early September to schedule you flu shot.  . Please call the [facility] [phone]) to schedule a routine screening mammogram.   Please call [facility] at [phone] to schedule your yearly follow up with Dr. Georgianne Fick in August.
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
## 2019-02-12 ENCOUNTER — Telehealth: Payer: Self-pay

## 2019-02-12 LAB — COMPREHENSIVE METABOLIC PANEL
ALT: 18 IU/L (ref 0–32)
AST: 24 IU/L (ref 0–40)
Albumin/Globulin Ratio: 1.6 (ref 1.2–2.2)
Albumin: 4.4 g/dL (ref 3.8–4.8)
Alkaline Phosphatase: 62 IU/L (ref 39–117)
BUN/Creatinine Ratio: 13 (ref 12–28)
BUN: 13 mg/dL (ref 8–27)
Bilirubin Total: 0.6 mg/dL (ref 0.0–1.2)
CO2: 25 mmol/L (ref 20–29)
Calcium: 9.3 mg/dL (ref 8.7–10.3)
Chloride: 103 mmol/L (ref 96–106)
Creatinine, Ser: 0.99 mg/dL (ref 0.57–1.00)
GFR calc Af Amer: 69 mL/min/{1.73_m2} (ref >59)
GFR calc non Af Amer: 60 mL/min/{1.73_m2} (ref >59)
Globulin, Total: 2.7 g/dL (ref 1.5–4.5)
Glucose: 91 mg/dL (ref 65–99)
Potassium: 4.5 mmol/L (ref 3.5–5.2)
Sodium: 140 mmol/L (ref 134–144)
Total Protein: 7.1 g/dL (ref 6.0–8.5)

## 2019-02-12 LAB — LIPID PANEL
Chol/HDL Ratio: 2.5 ratio (ref 0.0–4.4)
Cholesterol, Total: 135 mg/dL (ref 100–199)
HDL: 55 mg/dL (ref >39)
LDL Calculated: 70 mg/dL (ref 0–99)
Triglycerides: 52 mg/dL (ref 0–149)
VLDL Cholesterol Cal: 10 mg/dL (ref 5–40)

## 2019-02-12 LAB — TSH: TSH: 0.962 u[IU]/mL (ref 0.450–4.500)

## 2019-02-12 LAB — VITAMIN D 25 HYDROXY (VIT D DEFICIENCY, FRACTURES): Vit D, 25-Hydroxy: 51.1 ng/mL (ref 30.0–100.0)

## 2019-02-12 LAB — HEMOGLOBIN A1C
Est. average glucose Bld gHb Est-mCnc: 128 mg/dL
Hgb A1c MFr Bld: 6.1 % — ABNORMAL HIGH (ref 4.8–5.6)

## 2019-02-12 LAB — RENAL FUNCTION PANEL: Phosphorus: 3.1 mg/dL (ref 3.0–4.3)

## 2019-02-13 ENCOUNTER — Telehealth: Payer: Self-pay

## 2019-02-13 MED ORDER — PREDNISONE 10 MG PO TABS: ORAL_TABLET | ORAL | 0 refills | Status: DC

## 2019-02-13 NOTE — Telephone Encounter (Signed)
Pt advised; rx sent to [facility].   Thanks,   -Mickel Baas

## 2019-02-13 NOTE — Telephone Encounter (Signed)
-----   Message from Birdie Sons, MD sent at 02/12/2019  3:27 PM EDT ----- She does have narrowing of lumbar disk space, which is probably pinching on leg and causing pain going down her leg. Recommend 12 day prednisone taper. If that doesn't help she should see physical therapy. Let me know if not better when finished with prednisone.  Labs are good. Continue current medications.  Check labs yearly.

## 2019-02-16 ENCOUNTER — Telehealth: Payer: Self-pay

## 2019-02-17 ENCOUNTER — Telehealth: Payer: Self-pay

## 2019-02-17 ENCOUNTER — Ambulatory Visit: Payer: Self-pay | Admitting: Pharmacist

## 2019-02-17 DIAGNOSIS — I10 Essential (primary) hypertension: Secondary | ICD-10-CM

## 2019-02-17 DIAGNOSIS — R413 Other amnesia: Secondary | ICD-10-CM

## 2019-02-17 DIAGNOSIS — E7800 Pure hypercholesterolemia, unspecified: Secondary | ICD-10-CM

## 2019-02-17 NOTE — Chronic Care Management (AMB) (Signed)
  Chronic Care Management   Follow Up Note   02/17/2019 Name: KAYTELYNN SCRIPTER MRN: [id number] DOB: 17-Nov-1955  Subjective Fritzi I Mcconico is a 63 y.o. year old female who is a primary care patient of Fisher, Kirstie Peri, MD. The CCM clinical pharmacist following up today regarding pill packaging for patient's medications to improve adherence. HIPAA identifiers verified.    Assessment Review of patient status, including review of consultants reports, relevant laboratory and other test results, and collaboration with appropriate care team members and the patient's provider was performed as part of comprehensive patient evaluation and provision of chronic care management services.    Goals Addressed            This Visit's Progress   . I would like to get pill packs (pt-stated)       "I don't want to take so many pills"   Current Barriers:  . High pill burden . Memory deficit . Lack of caregiver support (daughter lives in [place])  Pharmacist Clinical Goal(s): Over the next 90 days, Ms. Deasis will be adherent to all medications as evidenced by patient report and pharmacy fill history  Over the next 30 days, Ms. Stankey will successfully transition to using pill packaging at retail pharmacy to help her keep her medications organized.   Interventions: . Counseling on compliance packaging  . Setting up compliance packaing at area pharmacy  o [facility] [address] Updated 8/4: [facility] can mail to patient's home or to local [facility] the pill packs; unfortunately will NOT package other pharmacy medications; patient has about 2 months of her medications from [facility] left   Patient Self Care Activities:  . Take all medications as prescribed  Please see past updates related to this goal by clicking on the "Past Updates" button in the selected goal          Telephone follow up appointment with care management team member scheduled for: 4 weeks when transition to pill packaging per  patient report of on hand medications is more accurarate Follow up with provider re: sending new RX to [place], PharmD Clinical Pharmacist [place] [phone]

## 2019-02-17 NOTE — Patient Instructions (Signed)
Goals Addressed            This Visit's Progress   . I would like to get pill packs (pt-stated)       "I don't want to take so many pills"   Current Barriers:  . High pill burden . Memory deficit . Lack of caregiver support (daughter lives in [place])  Pharmacist Clinical Goal(s): Over the next 90 days, Alexis [name] will be adherent to all medications as evidenced by patient report and pharmacy fill history  Over the next 30 days, Alexis [name] will successfully transition to using pill packaging at retail pharmacy to help her keep her medications organized.   Interventions: . Counseling on compliance packaging  . Setting up compliance packaing at area pharmacy  [name] [address] Updated 8/4: [facility] can mail to patient's home or to local [facility] the pill packs; unfortunately will NOT package other pharmacy medications; patient has about 2 months of her medications from [facility] left   Patient Self Care Activities:  . Take all medications as prescribed  Please see past updates related to this goal by clicking on the "Past Updates" button in the selected goal

## 2019-02-18 ENCOUNTER — Other Ambulatory Visit: Payer: Self-pay

## 2019-02-18 ENCOUNTER — Ambulatory Visit: Payer: Self-pay

## 2019-02-18 DIAGNOSIS — R413 Other amnesia: Secondary | ICD-10-CM

## 2019-02-18 DIAGNOSIS — H9193 Unspecified hearing loss, bilateral: Secondary | ICD-10-CM

## 2019-02-18 NOTE — Chronic Care Management (AMB) (Signed)
Chronic Care Management   Follow Up Note   02/18/2019 Name: Alexis [name] MRN: [id number] DOB: Apr 20, 1956  Referred by: Alexis Sons, MD Reason for referral : Chronic Care Management (follow up resources)   Subjective: "I have checked on my eyeglasses and my humana over the counter book came today"   Objective:  Lab Results  Component Value Date   HGBA1C 6.1 (H) 02/11/2019   BP Readings from Last 3 Encounters:  02/11/19 140/82  09/24/18 (!) 148/80  06/20/18 (!) 142/70   Lab Results  Component Value Date   CHOL 135 02/11/2019   HDL 55 02/11/2019   LDLCALC 70 02/11/2019   TRIG 52 02/11/2019   CHOLHDL 2.5 02/11/2019    Assessment:  Alexis [name] is a 63 year old female patient of Dr. Lelon Huh who was referred to the chronic case management team by her health plan. She was consented to services and initial telephone assessment with RN CM scheduled for today. She has a history of but not limited to CVA, OSA, Hypercholesteremia, Memory difficulty, and depression.   Review of patient status, including review of consultants reports, relevant laboratory and other test results, and collaboration with appropriate care team members and the patient's provider was performed as part of comprehensive patient evaluation and provision of chronic care management services.    Goals Addressed            This Visit's Progress   . COMPLETED: I really need glasses but they cost too much (pt-stated)       Alexis [name] have utilized the resources provided to follow up on all patient needs. Alexis [name] states she went to [employer] and picked out her glasses. She is responsible for 405.00 but they did take her [facility] insurance benefit and reduced the cost by 100.00. [name] will help pay for glasses.   Current Barriers:  Marland Kitchen Knowledge Deficits related to [facility] eye care benefit  Nurse Case Manager Clinical Goal(s):  Marland Kitchen Over the next 30 days, patient will work with  [name] to obtain new glasses utilizing humana medicare coverage [employer]  Interventions:  . Provided education to patient/[name] re: humana coverage for eye care and glasses  Patient Self Care Activities:  . Alexis [name] medicare insurance coverage to obtain new glasses  Initial goal documentation     . COMPLETED: per [name] "I really don't know what benefits mom has to help keep her healthy" (pt-stated)       Alexis [name] states she received her [employer] OTC catalogue in the mail today. Her [name] is on her way from [place] to visit and has informed patient that she will review benefits below with patient and assist patient with ordering needed OTC products from the catalogue. Alexis [name] continues to work with [facility] Pharmacist to obtain pill pack medication.   Current Barriers:  Marland Kitchen Knowledge Deficits related to knowledge of [employer] Medicare benefits  Nurse Case Manager Clinical Goal(s):  Marland Kitchen Over the next 14 days, patient/[name] will explore [employer] Medicare benefits including OTC Pharmacy benefits, hearing aid benefits, transportation benefits, and eye care benefits  Interventions:  . Provided [name] with contact information for [employer] OTC benefits [phone]) and discussed patients HMO plan 1 50.00/quarter benefit . Provided [name] with contact information for [facility] transportation ([employer] [phone]) and encouraged daugter to assist patient with setting up account . Discussed with [name] patients hearing aid coverage of 699.00/ear and encouraged to utilize . Provided [name] with number for [facility]  eyecare coverage for eyeglasses coverage and retailer . Discussed with [name] benefits of chronic case management program and provided patient and [name] with CCM RN CM contact information  Patient Self Care Activities:  . Self administers medications as prescribed . Attends all scheduled provider appointments . Calls pharmacy for medication refills .  Attends church or other social activities . Performs ADL's independently . Performs IADL's independently . Calls provider office for new concerns or questions  Initial goal documentation         The patient has been provided with contact information for the care management team and has been advised to call with any health related questions or concerns.     Angelin Cutrone E. Rollene Rotunda, RN, BSN Nurse Care Coordinator [facility] Care Management  [phone]

## 2019-02-19 NOTE — Patient Instructions (Signed)
Thank you allowing the Chronic Care Management Team to be a part of your care! It was a pleasure speaking with you today!  1. Please continue to work with [facility] Pharmacist until you have successfully obtained pill packs  CCM (Chronic Care Management) Team   Trish Fountain RN, BSN Nurse Care Coordinator  [phone]  Ruben Reason PharmD  Clinical Pharmacist  [phone]   Elliot Gurney, LCSW Clinical Social Worker [phone]  Goals Addressed            This Visit's Progress   . COMPLETED: I really need glasses but they cost too much (pt-stated)       Current Barriers:  Marland Kitchen Knowledge Deficits related to [facility] eye care benefit  Nurse Case Manager Clinical Goal(s):  Marland Kitchen Over the next 30 days, patient will work with daughter to obtain new glasses utilizing humana medicare coverage [employer]  Interventions:  . Provided education to patient/daughter re: humana coverage for eye care and glasses  Patient Self Care Activities:  . [name] medicare insurance coverage to obtain new glasses  Initial goal documentation     . COMPLETED: per daughter "I really don't know what benefits mom has to help keep her healthy" (pt-stated)       Current Barriers:  Marland Kitchen Knowledge Deficits related to knowledge of [employer] Medicare benefits  Nurse Case Manager Clinical Goal(s):  Marland Kitchen Over the next 14 days, patient/daughter will explore [employer] Medicare benefits including OTC Pharmacy benefits, hearing aid benefits, transportation benefits, and eye care benefits  Interventions:  . Provided daughter with contact information for [employer] OTC benefits [phone]) and discussed patients HMO plan 1 50.00/quarter benefit . Provided daughter with contact information for [facility] transportation ([employer] [phone]) and encouraged daugter to assist patient with setting up account . Discussed with daughter patients hearing aid coverage of 699.00/ear and encouraged to  utilize . Provided daughter with number for [facility] eyecare coverage for eyeglasses coverage and retailer . Discussed with daughter benefits of chronic case management program and provided patient and daughter with CCM RN CM contact information  Patient Self Care Activities:  . Self administers medications as prescribed . Attends all scheduled provider appointments . Calls pharmacy for medication refills . Attends church or other social activities . Performs ADL's independently . Performs IADL's independently . Calls provider office for new concerns or questions  Initial goal documentation        The patient verbalized understanding of instructions provided today and declined a print copy of patient instruction materials.   The patient has been provided with contact information for the care management team and has been advised to call with any health related questions or concerns.   SYMPTOMS OF A STROKE   You have any symptoms of stroke. "BE FAST" is an easy way to remember the main warning signs: ? B - Balance. Signs are dizziness, sudden trouble walking, or loss of balance. ? E - Eyes. Signs are trouble seeing or a sudden change in how you see. ? F - Face. Signs are sudden weakness or loss of feeling of the face, or the face or eyelid drooping on one side. ? A - Arms. Signs are weakness or loss of feeling in an arm. This happens suddenly and usually on one side of the body. ? S - Speech. Signs are sudden trouble speaking, slurred speech, or trouble understanding what people say. ? T - Time. Time to call emergency services. Write down what time symptoms started.  You  have other signs of stroke, such as: ? A sudden, very bad headache with no known cause. ? Feeling sick to your stomach (nausea). ? Throwing up (vomiting). ? Jerky movements you cannot control (seizure).  SYMPTOMS OF A HEART ATTACK  What are the signs or symptoms? Symptoms of this condition include:  Chest  pain. It may feel like: ? Crushing or squeezing. ? Tightness, pressure, fullness, or heaviness.  Pain in the arm, neck, jaw, back, or upper body.  Shortness of breath.  Heartburn.  Indigestion.  Nausea.  Cold sweats.  Feeling tired.  Sudden lightheadedness.

## 2019-02-23 ENCOUNTER — Other Ambulatory Visit: Payer: Self-pay

## 2019-02-23 ENCOUNTER — Ambulatory Visit (INDEPENDENT_AMBULATORY_CARE_PROVIDER_SITE_OTHER): Payer: Medicare HMO | Admitting: Obstetrics and Gynecology

## 2019-02-23 ENCOUNTER — Other Ambulatory Visit (HOSPITAL_COMMUNITY)
Admission: RE | Admit: 2019-02-23 | Discharge: 2019-02-23 | Disposition: A | Discharge status: Home / Self Care | Payer: Medicare HMO | Source: Ambulatory Visit | Attending: Obstetrics and Gynecology | Admitting: Obstetrics and Gynecology

## 2019-02-23 ENCOUNTER — Encounter: Payer: Self-pay | Admitting: Obstetrics and Gynecology

## 2019-02-23 ENCOUNTER — Telehealth: Payer: Self-pay

## 2019-02-23 VITALS — BP 124/84 | HR 65 | Wt 197.0 lb

## 2019-02-23 DIAGNOSIS — R102 Pelvic and perineal pain: Secondary | ICD-10-CM | POA: Insufficient documentation

## 2019-02-23 DIAGNOSIS — G8929 Other chronic pain: Secondary | ICD-10-CM | POA: Diagnosis not present

## 2019-02-23 DIAGNOSIS — Z113 Encounter for screening for infections with a predominantly sexual mode of transmission: Secondary | ICD-10-CM

## 2019-02-23 NOTE — Telephone Encounter (Signed)
-----   Message from Birdie Sons, MD sent at 02/23/2019  1:57 PM EDT ----- Slightly high average blood sugar of 128. Rest of labs are completely normal. Continue current medications.  Check labs yearly.
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
## 2019-02-23 NOTE — Telephone Encounter (Signed)
LMTCB 02/23/2019  Thanks,   -Laura  

## 2019-02-23 NOTE — Telephone Encounter (Signed)
Pt advised.   Thanks,   -Laura  

## 2019-02-24 DIAGNOSIS — R102 Pelvic and perineal pain: Secondary | ICD-10-CM | POA: Diagnosis not present

## 2019-02-24 DIAGNOSIS — G8929 Other chronic pain: Secondary | ICD-10-CM | POA: Diagnosis not present

## 2019-02-25 LAB — CERVICOVAGINAL ANCILLARY ONLY
Chlamydia: NEGATIVE
Neisseria Gonorrhea: NEGATIVE

## 2019-02-26 LAB — URINE CULTURE

## 2019-02-27 ENCOUNTER — Other Ambulatory Visit: Payer: Self-pay | Admitting: Obstetrics and Gynecology

## 2019-02-27 MED ORDER — CIPROFLOXACIN HCL 250 MG PO TABS: 500.0000 mg | ORAL_TABLET | Freq: Two times a day (BID) | ORAL | 0 refills | Status: AC

## 2019-03-06 ENCOUNTER — Ambulatory Visit (INDEPENDENT_AMBULATORY_CARE_PROVIDER_SITE_OTHER): Payer: Medicare HMO | Admitting: Obstetrics and Gynecology

## 2019-03-06 ENCOUNTER — Ambulatory Visit (INDEPENDENT_AMBULATORY_CARE_PROVIDER_SITE_OTHER): Payer: Medicare HMO

## 2019-03-06 ENCOUNTER — Other Ambulatory Visit: Payer: Self-pay

## 2019-03-06 DIAGNOSIS — Z113 Encounter for screening for infections with a predominantly sexual mode of transmission: Secondary | ICD-10-CM

## 2019-03-06 DIAGNOSIS — G8929 Other chronic pain: Secondary | ICD-10-CM | POA: Diagnosis not present

## 2019-03-06 DIAGNOSIS — D252 Subserosal leiomyoma of uterus: Secondary | ICD-10-CM

## 2019-03-06 DIAGNOSIS — R109 Unspecified abdominal pain: Secondary | ICD-10-CM | POA: Diagnosis not present

## 2019-03-06 DIAGNOSIS — R102 Pelvic and perineal pain: Secondary | ICD-10-CM

## 2019-03-06 DIAGNOSIS — D251 Intramural leiomyoma of uterus: Secondary | ICD-10-CM | POA: Diagnosis not present
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
## 2019-03-11 ENCOUNTER — Ambulatory Visit
Admission: RE | Admit: 2019-03-11 | Discharge: 2019-03-11 | Disposition: A | Discharge status: Home / Self Care | Payer: Medicare HMO | Source: Ambulatory Visit | Attending: Family Medicine | Admitting: Family Medicine

## 2019-03-11 DIAGNOSIS — E2839 Other primary ovarian failure: Secondary | ICD-10-CM | POA: Diagnosis not present

## 2019-03-11 DIAGNOSIS — Z780 Asymptomatic menopausal state: Secondary | ICD-10-CM | POA: Diagnosis not present

## 2019-03-11 DIAGNOSIS — M85852 Other specified disorders of bone density and structure, left thigh: Secondary | ICD-10-CM | POA: Diagnosis not present

## 2019-03-12 ENCOUNTER — Telehealth: Payer: Self-pay

## 2019-03-12 NOTE — Telephone Encounter (Signed)
Pt advised.   Thanks,   -Fatina Sprankle  

## 2019-03-12 NOTE — Telephone Encounter (Signed)
-----   Message from Birdie Sons, MD sent at 03/11/2019  9:30 PM EDT ----- Bone density test is normal. Repeat about 5 years.

## 2019-03-16 ENCOUNTER — Ambulatory Visit (INDEPENDENT_AMBULATORY_CARE_PROVIDER_SITE_OTHER): Payer: Medicare HMO | Admitting: Pharmacist

## 2019-03-16 DIAGNOSIS — E559 Vitamin D deficiency, unspecified: Secondary | ICD-10-CM

## 2019-03-16 DIAGNOSIS — R413 Other amnesia: Secondary | ICD-10-CM

## 2019-03-16 DIAGNOSIS — R32 Unspecified urinary incontinence: Secondary | ICD-10-CM

## 2019-03-16 DIAGNOSIS — E7800 Pure hypercholesterolemia, unspecified: Secondary | ICD-10-CM | POA: Diagnosis not present

## 2019-03-16 DIAGNOSIS — M542 Cervicalgia: Secondary | ICD-10-CM

## 2019-03-16 DIAGNOSIS — M7550 Bursitis of unspecified shoulder: Secondary | ICD-10-CM

## 2019-03-16 NOTE — Chronic Care Management (AMB) (Signed)
  Chronic Care Management   Care Coordination Note  03/16/2019 Name: Alexis [name] MRN: [id number] DOB: 14-Sep-1955  Care coordination for Alexis [name], patient of Dr. Lelon Huh. Alexis [name] is engaged with [facility] clinical pharmacist for pill packs for her medications.   Goals Addressed            This Visit's Progress   . I would like to get pill packs (pt-stated)       "I don't want to take so many pills"   Current Barriers:  . High pill burden . Memory deficit . Lack of caregiver support (daughter lives in [place])  Pharmacist Clinical Goal(s): Over the next 90 days, Alexis [name] will be adherent to all medications as evidenced by patient report and pharmacy fill history  Over the next 30 days, Alexis [name] will successfully transition to using pill packaging at retail pharmacy to help her keep her medications organized.   Interventions: . Counseling on compliance packaging  . Setting up compliance packaing at area pharmacy  o [name] [address] Updated 8/4: [facility]'s [facility] or [facility] packs; unfortunately will NOT package other pharmacy medications; patient has about 2 months of her medications from [facility] left   Updated 8/31: transitioned medications via [facility] to [facility] Patient Self Care Activities:  . Take all medications as prescribed  Please see past updates related to this goal by clicking on the "Past Updates" button in the selected goal           Follow up plan: Telephone follow up appointment with care management team member scheduled for: Thursday, September 3  Ruben Reason, PharmD Clinical Pharmacist [place] [phone]

## 2019-05-27 ENCOUNTER — Telehealth: Payer: Self-pay | Admitting: Family Medicine

## 2019-05-27 ENCOUNTER — Other Ambulatory Visit: Payer: Self-pay | Admitting: Family Medicine

## 2019-05-27 DIAGNOSIS — R1013 Epigastric pain: Secondary | ICD-10-CM

## 2019-05-27 DIAGNOSIS — M542 Cervicalgia: Secondary | ICD-10-CM

## 2019-05-27 MED ORDER — ATORVASTATIN CALCIUM 20 MG PO TABS: 20.0000 mg | ORAL_TABLET | Freq: Every day | ORAL | 4 refills | Status: DC

## 2019-05-27 NOTE — Telephone Encounter (Signed)
I called patient. She changed her mind and wants prescriptions sent to [address]. I called pharmacy and was advised that patient has remaining refills on Gabapentin and Omeprazole. Patient was due for a refill on Atorvastatin, so I authorized refill.   Patient mentioned that she needs a refill on her blood pressure medication, but she didn't know the name of it. Is she supposed to be on a blood pressure medication?

## 2019-05-27 NOTE — Telephone Encounter (Signed)
Pt contacted office for refill request on the following medications:  1. gabapentin (NEURONTIN) 300 MG capsule 2. omeprazole (PRILOSEC) 40 MG capsule  3. atorvastatin (LIPITOR) 20 MG tablet  90 days supply [facility] [address] Pt stated that she tried to get [facility] to transfer her medications to [place] but they haven't so pt is requesting new Rx be sent to [place]. Please advise. Thanks TNP

## 2019-05-29 ENCOUNTER — Encounter: Payer: Self-pay | Admitting: Family Medicine

## 2019-05-29 ENCOUNTER — Other Ambulatory Visit: Payer: Self-pay

## 2019-05-29 ENCOUNTER — Ambulatory Visit (INDEPENDENT_AMBULATORY_CARE_PROVIDER_SITE_OTHER): Payer: Medicare HMO | Admitting: Family Medicine

## 2019-05-29 VITALS — BP 158/91 | HR 71 | Temp 96.9°F | Wt 197.0 lb

## 2019-05-29 DIAGNOSIS — M79605 Pain in left leg: Secondary | ICD-10-CM

## 2019-05-29 DIAGNOSIS — M79604 Pain in right leg: Secondary | ICD-10-CM

## 2019-05-29 DIAGNOSIS — M7551 Bursitis of right shoulder: Secondary | ICD-10-CM

## 2019-05-29 MED ORDER — GABAPENTIN 600 MG PO TABS: 600.0000 mg | ORAL_TABLET | Freq: Two times a day (BID) | ORAL | 1 refills | Status: DC

## 2019-05-29 MED ORDER — PREDNISONE 10 MG PO TABS: ORAL_TABLET | ORAL | 0 refills | Status: AC

## 2019-05-29 NOTE — Progress Notes (Signed)
Patient: Alexis [name] Female    DOB: 02-Sep-1955   63 y.o.   MRN: [id number] Visit Date: 05/29/2019  Today's Provider: Lelon Huh, MD   Chief Complaint  Patient presents with  . Joint Pain   Subjective:     Arthritis Presents for initial visit. The disease course has been worsening. The condition has lasted for 3 months. She complains of pain. Affected locations include the right shoulder (bilateral leg). Pertinent negatives include no fatigue or fever. Treatments tried: Gabapentin. The treatment provided no relief.  Has had similar pain off and on for years diagnosed subacromial bursitis which usually responds well to prednisone, but has always declined articular injections.   She states she took Lyrica a few years ago which she states worked better than gabapentin.  Also having migraines up to three times a week.     No Known Allergies   Current Outpatient Medications:  .  Acetaminophen (TYLENOL PO), Take by mouth as needed., Disp: , Rfl:  .  atorvastatin (LIPITOR) 20 MG tablet, Take 1 tablet (20 mg total) by mouth daily., Disp: 90 tablet, Rfl: 4 .  gabapentin (NEURONTIN) 300 MG capsule, TAKE 1 CAPSULE BY MOUTH TWICE DAILY, Disp: 180 capsule, Rfl: 4 .  lubiprostone (AMITIZA) 24 MCG capsule, Take 24 mcg by mouth daily with breakfast. , Disp: , Rfl:  .  naproxen (NAPROSYN) 500 MG tablet, TAKE 1 TABLET BY MOUTH TWICE DAILY WITH A MEAL, Disp: 60 tablet, Rfl: 1 .  omeprazole (PRILOSEC) 40 MG capsule, TAKE 1 CAPSULE BY MOUTH ONCE DAILY (Patient not taking: Reported on 02/11/2019), Disp: 90 capsule, Rfl: 4 .  oxybutynin (DITROPAN) 5 MG tablet, Take 5 mg by mouth 2 (two) times daily., Disp: , Rfl:  .  Vitamin D, Ergocalciferol, (DRISDOL) 50000 units CAPS capsule, Take 1 capsule (50,000 Units total) by mouth every 7 (seven) days., Disp: 12 capsule, Rfl: 4  Review of Systems  Constitutional: Negative for appetite change, chills, fatigue and fever.  Respiratory: Negative for  chest tightness and shortness of breath.   Cardiovascular: Negative for chest pain and palpitations.  Gastrointestinal: Negative for abdominal pain, nausea and vomiting.  Musculoskeletal: Positive for arthralgias, arthritis and myalgias.  Neurological: Negative for dizziness and weakness.    Social History   Tobacco Use  . Smoking status: Never Smoker  . Smokeless tobacco: Never Used  Substance Use Topics  . Alcohol use: No      Objective:   BP (!) 158/91 (BP Location: Right Arm, Patient Position: Sitting, Cuff Size: Normal)   Pulse 71   Temp (!) 96.9 F (36.1 C) (Temporal)   Wt 197 lb (89.4 kg)   BMI 31.80 kg/m  Vitals:   05/29/19 1348  BP: (!) 158/91  Pulse: 71  Temp: (!) 96.9 F (36.1 C)  TempSrc: Temporal  Weight: 197 lb (89.4 kg)  Body mass index is 31.8 kg/m.   Physical Exam  General appearance: Obese female, cooperative and in no acute distress Head: Normocephalic, without obvious abnormality, atraumatic Respiratory: Respirations even and unlabored, normal respiratory rate Extremities: Positive impingement sign both shoulders, more so on right.       Assessment & Plan    1. Subacromial bursitis of right shoulder joint She again declined articular steroid injection and orthopedic referral  - predniSONE (DELTASONE) 10 MG tablet; 6 tablets for 2 days, then 5 for 2 days, then 4 for 2 days, then 3 for 2 days, then 2 for 2 days, then  1 for 2 days.  Dispense: 42 tablet; Refill: 0  2. Pain in both lower extremities Likely neuropathy, is already on relative low dose of gabapentin which we will double to - gabapentin (NEURONTIN) 600 MG tablet; Take 1 tablet (600 mg total) by mouth 2 (two) times daily.  Dispense: 60 tablet; Refill: 1  She does report effective treatment with Lyrica in the past which was only changed to gabapentin due to cost. Consider switching back if increased dose of gabapentin is not effective.   The entirety of the information documented in  the History of Present Illness, Review of Systems and Physical Exam were personally obtained by me. Portions of this information were initially documented by Idelle Jo, CMA and reviewed by me for thoroughness and accuracy.     Lelon Huh, MD  [facility]

## 2019-05-29 NOTE — Patient Instructions (Addendum)
.   Please review the attached list of medications and notify my office if there are any errors.   . Please bring all of your medications to every appointment so we can make sure that our medication list is the same as yours.   . Let me know if the higher dose of gabapentin works better, and I can send in a 90 day supply.  If not then let me know and will change back to Lyrica which is now available as a generic   Try OTC Voltaren Gel on your shoulder 2-3 times a day   Let me know if your shoulder is not feeling much better in a week or two. If so then we can refer you to an orthopedist.

## 2019-07-23 ENCOUNTER — Telehealth: Payer: Self-pay

## 2019-08-29 ENCOUNTER — Ambulatory Visit: Payer: Medicare HMO | Attending: Internal Medicine

## 2019-08-29 DIAGNOSIS — Z23 Encounter for immunization: Secondary | ICD-10-CM | POA: Insufficient documentation

## 2019-08-29 NOTE — Progress Notes (Signed)
   Covid-19 Vaccination Clinic  Name:  Alexis [name]    MRN: [id number] DOB: 06-Feb-1956  08/29/2019  [name] was observed post Covid-19 immunization for 15 minutes without incidence. She was provided with Vaccine Information Sheet and instruction to access the V-Safe system.   [name] was instructed to call 911 with any severe reactions post vaccine: Marland Kitchen Difficulty breathing  . Swelling of your face and throat  . A fast heartbeat  . A bad rash all over your body  . Dizziness and weakness    Immunizations Administered    Name Date Dose VIS Date Route   Pfizer COVID-19 Vaccine 08/29/2019  9:29 AM 0.3 mL 06/26/2019 Intramuscular   Manufacturer: [place]   Lot: [id number]   [place]: [id number]

## 2019-09-18 DIAGNOSIS — H524 Presbyopia: Secondary | ICD-10-CM | POA: Diagnosis not present

## 2019-09-18 DIAGNOSIS — H52223 Regular astigmatism, bilateral: Secondary | ICD-10-CM | POA: Diagnosis not present

## 2019-09-19 ENCOUNTER — Ambulatory Visit: Payer: Medicare HMO | Attending: Internal Medicine

## 2019-09-19 DIAGNOSIS — Z23 Encounter for immunization: Secondary | ICD-10-CM

## 2019-09-19 NOTE — Progress Notes (Signed)
   Covid-19 Vaccination Clinic  Name:  Alexis [name]    MRN: [id number] DOB: 1955/08/16  09/19/2019  [name] was observed post Covid-19 immunization for 15 minutes without incident. She was provided with Vaccine Information Sheet and instruction to access the V-Safe system.   [name] was instructed to call 911 with any severe reactions post vaccine: Marland Kitchen Difficulty breathing  . Swelling of face and throat  . A fast heartbeat  . A bad rash all over body  . Dizziness and weakness   Immunizations Administered    Name Date Dose VIS Date Route   Pfizer COVID-19 Vaccine 09/19/2019 10:05 AM 0.3 mL 06/26/2019 Intramuscular   Manufacturer: [place]   Lot: [id number]   [place]: [id number]

## 2019-09-22 ENCOUNTER — Other Ambulatory Visit: Payer: Self-pay | Admitting: Family Medicine

## 2019-09-22 DIAGNOSIS — M542 Cervicalgia: Secondary | ICD-10-CM

## 2019-09-24 ENCOUNTER — Other Ambulatory Visit: Payer: Self-pay | Admitting: Family Medicine

## 2019-09-24 DIAGNOSIS — R1013 Epigastric pain: Secondary | ICD-10-CM
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
## 2019-09-28 ENCOUNTER — Other Ambulatory Visit: Payer: Self-pay

## 2019-09-28 ENCOUNTER — Ambulatory Visit (INDEPENDENT_AMBULATORY_CARE_PROVIDER_SITE_OTHER): Payer: Medicare HMO

## 2019-09-28 DIAGNOSIS — Z0000 Encounter for general adult medical examination without abnormal findings: Secondary | ICD-10-CM | POA: Diagnosis not present

## 2019-09-28 NOTE — Patient Instructions (Signed)
Alexis [name] , Thank you for taking time to come for your Medicare Wellness Visit. I appreciate your ongoing commitment to your health goals. Please review the following plan we discussed and let me know if I can assist you in the future.   Screening recommendations/referrals: Colonoscopy: Up to date, due 02/2020 Mammogram: Up to date, due 12/2019 Bone Density: Up to date, due 02/2024 Recommended yearly ophthalmology/optometry visit for glaucoma screening and checkup Recommended yearly dental visit for hygiene and checkup  Vaccinations: Influenza vaccine: Pt declines today.  Pneumococcal vaccine: Pt declines today.  Tdap vaccine: Pt declines today.  Shingles vaccine: Pt declines today.     Advanced directives: Currently on file.   Conditions/risks identified: Continue to eat 3 small meals a day with 2 healthy snack in between.   Next appointment: 09/30/19 with Dr Caryn Section   Preventive Care 3 Years and Older, Female Preventive care refers to lifestyle choices and visits with your health care provider that can promote health and wellness. What does preventive care include?  A yearly physical exam. This is also called an annual well check.  Dental exams once or twice a year.  Routine eye exams. Ask your health care provider how often you should have your eyes checked.  Personal lifestyle choices, including:  Daily care of your teeth and gums.  Regular physical activity.  Eating a healthy diet.  Avoiding tobacco and drug use.  Limiting alcohol use.  Practicing safe sex.  Taking low-dose aspirin every day.  Taking vitamin and mineral supplements as recommended by your health care provider. What happens during an annual well check? The services and screenings done by your health care provider during your annual well check will depend on your age, overall health, lifestyle risk factors, and family history of disease. Counseling  Your health care provider may ask you questions  about your:  Alcohol use.  Tobacco use.  Drug use.  Emotional well-being.  Home and relationship well-being.  Sexual activity.  Eating habits.  History of falls.  Memory and ability to understand (cognition).  Work and work Statistician.  Reproductive health. Screening  You may have the following tests or measurements:  Height, weight, and BMI.  Blood pressure.  Lipid and cholesterol levels. These may be checked every 5 years, or more frequently if you are over 58 years old.  Skin check.  Lung cancer screening. You may have this screening every year starting at age 38 if you have a 30-pack-year history of smoking and currently smoke or have quit within the past 15 years.  Fecal occult blood test (FOBT) of the stool. You may have this test every year starting at age 54.  Flexible sigmoidoscopy or colonoscopy. You may have a sigmoidoscopy every 5 years or a colonoscopy every 10 years starting at age 83.  Hepatitis C blood test.  Hepatitis B blood test.  Sexually transmitted disease (STD) testing.  Diabetes screening. This is done by checking your blood sugar (glucose) after you have not eaten for a while (fasting). You may have this done every 1-3 years.  Bone density scan. This is done to screen for osteoporosis. You may have this done starting at age 109.  Mammogram. This may be done every 1-2 years. Talk to your health care provider about how often you should have regular mammograms. Talk with your health care provider about your test results, treatment options, and if necessary, the need for more tests. Vaccines  Your health care provider may recommend certain vaccines, such  as:  Influenza vaccine. This is recommended every year.  Tetanus, diphtheria, and acellular pertussis (Tdap, Td) vaccine. You may need a Td booster every 10 years.  Zoster vaccine. You may need this after age 11.  Pneumococcal 13-valent conjugate (PCV13) vaccine. One dose is  recommended after age 36.  Pneumococcal polysaccharide (PPSV23) vaccine. One dose is recommended after age 64. Talk to your health care provider about which screenings and vaccines you need and how often you need them. This information is not intended to replace advice given to you by your health care provider. Make sure you discuss any questions you have with your health care provider. Document Released: 07/29/2015 Document Revised: 03/21/2016 Document Reviewed: 05/03/2015 Elsevier Interactive Patient Education  2017 [place] Prevention in the Home Falls can cause injuries. They can happen to people of all ages. There are many things you can do to make your home safe and to help prevent falls. What can I do on the outside of my home?  Regularly fix the edges of walkways and driveways and fix any cracks.  Remove anything that might make you trip as you walk through a door, such as a raised step or threshold.  Trim any bushes or trees on the path to your home.  Use bright outdoor lighting.  Clear any walking paths of anything that might make someone trip, such as rocks or tools.  Regularly check to see if handrails are loose or broken. Make sure that both sides of any steps have handrails.  Any raised decks and porches should have guardrails on the edges.  Have any leaves, snow, or ice cleared regularly.  Use sand or salt on walking paths during winter.  Clean up any spills in your garage right away. This includes oil or grease spills. What can I do in the bathroom?  Use night lights.  Install grab bars by the toilet and in the tub and shower. Do not use towel bars as grab bars.  Use non-skid mats or decals in the tub or shower.  If you need to sit down in the shower, use a plastic, non-slip stool.  Keep the floor dry. Clean up any water that spills on the floor as soon as it happens.  Remove soap buildup in the tub or shower regularly.  Attach bath mats  securely with double-sided non-slip rug tape.  Do not have throw rugs and other things on the floor that can make you trip. What can I do in the bedroom?  Use night lights.  Make sure that you have a light by your bed that is easy to reach.  Do not use any sheets or blankets that are too big for your bed. They should not hang down onto the floor.  Have a firm chair that has side arms. You can use this for support while you get dressed.  Do not have throw rugs and other things on the floor that can make you trip. What can I do in the kitchen?  Clean up any spills right away.  Avoid walking on wet floors.  Keep items that you use a lot in easy-to-reach places.  If you need to reach something above you, use a strong step stool that has a grab bar.  Keep electrical cords out of the way.  Do not use floor polish or wax that makes floors slippery. If you must use wax, use non-skid floor wax.  Do not have throw rugs and other things on the  floor that can make you trip. What can I do with my stairs?  Do not leave any items on the stairs.  Make sure that there are handrails on both sides of the stairs and use them. Fix handrails that are broken or loose. Make sure that handrails are as long as the stairways.  Check any carpeting to make sure that it is firmly attached to the stairs. Fix any carpet that is loose or worn.  Avoid having throw rugs at the top or bottom of the stairs. If you do have throw rugs, attach them to the floor with carpet tape.  Make sure that you have a light switch at the top of the stairs and the bottom of the stairs. If you do not have them, ask someone to add them for you. What else can I do to help prevent falls?  Wear shoes that:  Do not have high heels.  Have rubber bottoms.  Are comfortable and fit you well.  Are closed at the toe. Do not wear sandals.  If you use a stepladder:  Make sure that it is fully opened. Do not climb a closed  stepladder.  Make sure that both sides of the stepladder are locked into place.  Ask someone to hold it for you, if possible.  Clearly mark and make sure that you can see:  Any grab bars or handrails.  First and last steps.  Where the edge of each step is.  Use tools that help you move around (mobility aids) if they are needed. These include:  Canes.  Walkers.  Scooters.  Crutches.  Turn on the lights when you go into a dark area. Replace any light bulbs as soon as they burn out.  Set up your furniture so you have a clear path. Avoid moving your furniture around.  If any of your floors are uneven, fix them.  If there are any pets around you, be aware of where they are.  Review your medicines with your doctor. Some medicines can make you feel dizzy. This can increase your chance of falling. Ask your doctor what other things that you can do to help prevent falls. This information is not intended to replace advice given to you by your health care provider. Make sure you discuss any questions you have with your health care provider. Document Released: 04/28/2009 Document Revised: 12/08/2015 Document Reviewed: 08/06/2014 Elsevier Interactive Patient Education  2017 [employer].

## 2019-09-29 NOTE — Progress Notes (Signed)
Patient: Alexis [name]   DOB: 1955-07-24   64 y.o. Female  MRN: [id number] Visit Date: 09/30/2019  Today's Provider: Lelon Huh, MD  Subjective:    Chief Complaint  Patient presents with  . Back Pain  . Nail Problem   Back Pain This is a chronic problem. The current episode started more than 1 year ago (recently started flaring up 1 month ago). The problem occurs intermittently. The problem has been gradually worsening since onset. The pain is present in the lumbar spine. The quality of the pain is described as aching. The pain radiates to the left thigh. Associated symptoms include headaches. Pertinent negatives include no abdominal pain, chest pain (achy pain started 1 month ago), fever or weakness. She has tried nothing for the symptoms.   Toenail discoloration: Patient comes in for an evaluation of discoloration of toenails. She describes the discoloration as black longitudinal streaks through out the nail. She states it has been like this for several years. Patient denies any toenail pain   Past Surgical History:  Procedure Laterality Date  . BACK SURGERY     disc pressing against a nerve x 2  . CERVICAL SPINE SURGERY     x 3   [place]  . COLONOSCOPY    . COLONOSCOPY WITH PROPOFOL N/A 02/14/2015   Procedure: COLONOSCOPY WITH PROPOFOL;  Surgeon: Hulen Luster, MD;  Location: [facility] ENDOSCOPY;  Service: Gastroenterology;  Laterality: N/A;  . POLYPECTOMY    . RLE Varicosities  laser surgery    09/2010  . varicose vein ligation and stripping     Right     Medications: Outpatient Medications Prior to Visit  Medication Sig Dispense Refill  . Acetaminophen (TYLENOL PO) Take by mouth as needed.    Marland Kitchen atorvastatin (LIPITOR) 20 MG tablet Take 1 tablet (20 mg total) by mouth daily. 90 tablet 4  . gabapentin (NEURONTIN) 600 MG tablet Take 1 tablet (600 mg total) by mouth 2 (two) times daily. 60 tablet 1  . lubiprostone (AMITIZA) 24 MCG capsule Take 24 mcg by mouth daily with  breakfast.     . omeprazole (PRILOSEC) 40 MG capsule Take 1 capsule by mouth once daily 90 capsule 0  . oxybutynin (DITROPAN) 5 MG tablet Take 5 mg by mouth 2 (two) times daily.    . Vitamin D, Ergocalciferol, (DRISDOL) 50000 units CAPS capsule Take 1 capsule (50,000 Units total) by mouth every 7 (seven) days. 12 capsule 4  . naproxen (NAPROSYN) 500 MG tablet TAKE 1 TABLET BY MOUTH TWICE DAILY WITH A MEAL (Patient not taking: Reported on 09/30/2019) 60 tablet 1   No facility-administered medications prior to visit.      Review of Systems  Constitutional: Negative for appetite change, chills, fatigue and fever.  Eyes: Positive for visual disturbance (blurred vision).  Respiratory: Negative for chest tightness and shortness of breath.   Cardiovascular: Negative for chest pain (achy pain started 1 month ago) and palpitations.  Gastrointestinal: Negative for abdominal pain, nausea and vomiting.  Musculoskeletal: Positive for back pain.  Neurological: Positive for headaches. Negative for dizziness and weakness.      Objective:    BP (!) 150/90 (BP Location: Right Arm, Cuff Size: Large)   Pulse (!) 59   Temp (!) 96.7 F (35.9 C) (Temporal)   Resp 18   Wt 198 lb (89.8 kg)   SpO2 99% Comment: room air  BMI 31.96 kg/m    Physical Exam  General: Appearance:    Obese female in no acute distress  Eyes:    PERRL, conjunctiva/corneas clear, EOM's intact       Lungs:     Clear to auscultation bilaterally, respirations unlabored  Heart:    Bradycardic. Normal rhythm. No murmurs, rubs, or gallops.   MS:   All extremities are intact. Tender left parathoracic muscles and right anterior shoulder. FROM of shoulder.   Neurologic:   Awake, alert, oriented x 3. No apparent focal neurological           defect.   Ext:   Darkened brownish yellow thickened dystrophic toenails.     No results found for any visits on 09/30/19.    Assessment & Plan:    1. Bursitis of right shoulder Recommend  alternating application of heat and ice. Relative rest. Discussed referral to orthopedics if not steadily improving in the next week or two.   2. Chronic midline low back pain without sciatica  - methocarbamol (ROBAXIN) 750 MG tablet; Take 1 tablet (750 mg total) by mouth 3 (three) times daily.  Dispense: 30 tablet; Refill: 2  3. Onychomycosis  - terbinafine (LAMISIL) 250 MG tablet; Take 2 tablets (500 mg total) by mouth daily. Take for 1 week out of every 4 weeks  Dispense: 14 tablet; Refill: 5  The entirety of the information documented in the History of Present Illness, Review of Systems and Physical Exam were personally obtained by me. Portions of this information were initially documented by Meyer Cory, CMA and reviewed by me for thoroughness and accuracy.       Lelon Huh, MD  [facility] [phone] (phone) [phone] (fax)  [place]

## 2019-09-30 ENCOUNTER — Other Ambulatory Visit: Payer: Self-pay

## 2019-09-30 ENCOUNTER — Encounter: Payer: Self-pay | Admitting: Family Medicine

## 2019-09-30 ENCOUNTER — Telehealth: Payer: Self-pay

## 2019-09-30 ENCOUNTER — Ambulatory Visit (INDEPENDENT_AMBULATORY_CARE_PROVIDER_SITE_OTHER): Payer: Medicare HMO | Admitting: Family Medicine

## 2019-09-30 VITALS — BP 150/90 | HR 59 | Temp 96.7°F | Resp 18 | Wt 198.0 lb

## 2019-09-30 DIAGNOSIS — B351 Tinea unguium: Secondary | ICD-10-CM | POA: Diagnosis not present

## 2019-09-30 DIAGNOSIS — M7551 Bursitis of right shoulder: Secondary | ICD-10-CM

## 2019-09-30 DIAGNOSIS — G8929 Other chronic pain: Secondary | ICD-10-CM

## 2019-09-30 DIAGNOSIS — M545 Low back pain, unspecified: Secondary | ICD-10-CM

## 2019-09-30 MED ORDER — TERBINAFINE HCL 250 MG PO TABS: 500.0000 mg | ORAL_TABLET | Freq: Every day | ORAL | 5 refills | Status: DC

## 2019-09-30 MED ORDER — METHOCARBAMOL 750 MG PO TABS: 750.0000 mg | ORAL_TABLET | Freq: Three times a day (TID) | ORAL | 2 refills | Status: DC

## 2019-09-30 NOTE — Telephone Encounter (Signed)
I called and spoke with patient's sister Monia Pouch. I advised her of the prescriptions that were prescribed during today's office visit. Bubba Hales wants to know if patients blood pressure needs to be addressed. She states the patients blood pressure seems to be running high each time she comes into the office. She wants to know if patient needs to be on medication to help lower blood pressure?

## 2019-09-30 NOTE — Telephone Encounter (Signed)
Copied from [place] [id number]. Topic: General - Other >> Sep 30, 2019 12:06 PM Alanda Slim E wrote: Reason for CRM: Pts sister called to speaker with Dr. Caryn Section pt had an appt but was not able to tell her sister what was advised by Dr. Caryn Section or what happened during appt/ Monia Pouch would like to speak with nurse or Dr. Caryn Section to find out any important information that the Pt needs to follow / please advise

## 2019-10-01 NOTE — Telephone Encounter (Signed)
Patient's sister Monia Pouch advised. She states she will get patient to call back to schedule a follow up

## 2019-10-01 NOTE — Telephone Encounter (Signed)
She is usually feeling bad when she comes in which makes her BP. She needs to schedule routine check up when she is feeling to check on her BP and her blood sugar which was a little high when checked last year.   She should schedule routine follow up in 1-2 months.

## 2019-11-11 ENCOUNTER — Telehealth: Payer: Self-pay | Admitting: Family Medicine

## 2019-11-11 NOTE — Chronic Care Management (AMB) (Signed)
°  Care Management   Note  11/11/2019 Name: [name] MRN: [id number] DOB: [birth year]  Alexis [name] is a 64 y.o. year old female who is a primary care patient of Caryn Section, Kirstie Peri, MD and is actively engaged with the care management team. I reached out to Alexis [name] by phone today to assist with scheduling an initial visit with the Pharmacist  Follow up plan: Telephone appointment with care management team member scheduled for:11/27/2019  Noreene Larsson, [address], [place], [place] [address] Direct Dial: [phone] [email or web address] Website: [email or web address]

## 2019-11-27 ENCOUNTER — Ambulatory Visit: Payer: Medicare HMO | Admitting: Pharmacist

## 2019-11-27 ENCOUNTER — Other Ambulatory Visit: Payer: Self-pay

## 2019-11-27 DIAGNOSIS — E7800 Pure hypercholesterolemia, unspecified: Secondary | ICD-10-CM

## 2019-11-27 DIAGNOSIS — I10 Essential (primary) hypertension: Secondary | ICD-10-CM
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
## 2019-11-27 NOTE — Patient Instructions (Addendum)
Visit Information  Goals Addressed            This Visit's Progress   . Chronic Pain - improve control        CARE PLAN ENTRY (see longitudinal plan of care for additional care plan information)  Current Barriers:  . Chronic leg and sternal pain with multiple comorbidities including hypertension, hyperlipidemia . Self-manages medications with treatment gaps   Pharmacist Clinical Goal(s):  Marland Kitchen Over the next 90 days, patient will work with PharmD and provider towards optimized medication management  Interventions: . Comprehensive medication review performed; medication list updated in electronic medical record . Inter-disciplinary care team collaboration (see longitudinal plan of care) . Tylenol 1000mg  three times daily scheduled  Patient Self Care Activities:  . Patient will take medications as prescribed . Patient will focus on improved adherence by taking Tylenol 1000mg  three times daily . Stop taking naproxen and ibuprofen  Initial goal documentation     . Hypertension - goal BP < 140/90       CARE PLAN ENTRY (see longitudinal plan of care for additional care plan information)  Current Barriers:  . Uncontrolled hypertension, complicated by medication awareness  . Current antihypertensive regimen: None . Previous antihypertensives tried: Maxzide was effective and well tolerated . Last practice recorded BP readings:  BP Readings from Last 3 Encounters:  09/30/19 (!) 150/90  05/29/19 (!) 158/91  02/23/19 124/84 .   Marland Kitchen Current home BP readings: systolic 99991111 .  Kidney Function Lab Results  Component Value Date/Time   CREATININE 0.99 02/11/2019 10:58 AM   CREATININE 0.83 12/26/2017 10:56 AM   CREATININE 0.83 06/02/2012 11:08 AM   GFRNONAA 60 02/11/2019 10:58 AM   GFRAA 69 02/11/2019 10:58 AM    Pharmacist Clinical Goal(s):  Marland Kitchen Over the next 90 days, patient will work with PharmD and providers to restart antihypertensive  regimen  Interventions: . Inter-disciplinary care team collaboration (see longitudinal plan of care) . Comprehensive medication review performed; medication list updated in the electronic medical record.  . Restart Maxzide 37.5-25mg  1 tab by mouth daily  Patient Self Care Activities:  . Patient will continue to check BP daily , document, and provide at future appointments . Patient will focus on medication adherence by taking Maxzide daily  Initial goal documentation     . Medication Management - goal continuity of therapy        CARE PLAN ENTRY (see longitudinal plan of care for additional care plan information)  Current Barriers:  . Polypharmacy; complex patient with multiple comorbidities including old stroke, chronic pain, hypertension, hyperlipidemia . Self-manages medications but sometimes forgets to get refills and reorders . Can't find $50 [employer] OTC benefit online   Pharmacist Clinical Goal(s):  Marland Kitchen Over the next 90 days, patient will work with PharmD and provider towards optimized medication management  Interventions: . Comprehensive medication review performed; medication list updated in electronic medical record . Inter-disciplinary care team collaboration (see longitudinal plan of care) . Medication list updates . Get Lamisil refill and restart Maxzide blood pressure medication  Patient Self Care Activities:  . Patient will take medications as prescribed . Patient will focus on improved adherence by remembering to get refills and contact provider when refills are zero  Initial goal documentation        Alexis [name] was given information about Chronic Care Management services today including:  1. CCM service includes personalized support from designated clinical staff supervised by her physician, including individualized plan of care and coordination with  other care providers 2. 24/7 contact phone numbers for assistance for urgent and routine care needs. 3. Standard  insurance, coinsurance, copays and deductibles apply for chronic care management only during months in which we provide at least 20 minutes of these services. Most insurances cover these services at 100%, however patients may be responsible for any copay, coinsurance and/or deductible if applicable. This service may help you avoid the need for more expensive face-to-face services. 4. Only one practitioner may furnish and bill the service in a calendar month. 5. The patient may stop CCM services at any time (effective at the end of the month) by phone call to the office staff.  Patient agreed to services and verbal consent obtained.   Print copy of patient instructions provided.  Telephone follow up appointment with pharmacy team member scheduled for: 1 month  Milus Height, PharmD, [place], [place] [phone]   Hypertension, Adult Hypertension is another name for high blood pressure. High blood pressure forces your heart to work harder to pump blood. This can cause problems over time. There are two numbers in a blood pressure reading. There is a top number (systolic) over a bottom number (diastolic). It is best to have a blood pressure that is below 120/80. Healthy choices can help lower your blood pressure, or you may need medicine to help lower it. What are the causes? The cause of this condition is not known. Some conditions may be related to high blood pressure. What increases the risk?  Smoking.  Having type 2 diabetes mellitus, high cholesterol, or both.  Not getting enough exercise or physical activity.  Being overweight.  Having too much fat, sugar, calories, or salt (sodium) in your diet.  Drinking too much alcohol.  Having long-term (chronic) kidney disease.  Having a family history of high blood pressure.  Age. Risk increases with age.  Race. You may be at higher risk if you are African American.  Gender. Men are at higher  risk than women before age 38. After age 49, women are at higher risk than men.  Having obstructive sleep apnea.  Stress. What are the signs or symptoms?  High blood pressure may not cause symptoms. Very high blood pressure (hypertensive crisis) may cause: ? Headache. ? Feelings of worry or nervousness (anxiety). ? Shortness of breath. ? Nosebleed. ? A feeling of being sick to your stomach (nausea). ? Throwing up (vomiting). ? Changes in how you see. ? Very bad chest pain. ? Seizures. How is this treated?  This condition is treated by making healthy lifestyle changes, such as: ? Eating healthy foods. ? Exercising more. ? Drinking less alcohol.  Your health care provider may prescribe medicine if lifestyle changes are not enough to get your blood pressure under control, and if: ? Your top number is above 130. ? Your bottom number is above 80.  Your personal target blood pressure may vary. Follow these instructions at home: Eating and drinking   If told, follow the DASH eating plan. To follow this plan: ? Fill one half of your plate at each meal with fruits and vegetables. ? Fill one fourth of your plate at each meal with whole grains. Whole grains include whole-wheat pasta, brown rice, and whole-grain bread. ? Eat or drink low-fat dairy products, such as skim milk or low-fat yogurt. ? Fill one fourth of your plate at each meal with low-fat (lean) proteins. Low-fat proteins include fish, chicken without skin, eggs, beans, and tofu. ? Avoid  fatty meat, cured and processed meat, or chicken with skin. ? Avoid pre-made or processed food.  Eat less than 1,500 mg of salt each day.  Do not drink alcohol if: ? Your doctor tells you not to drink. ? You are pregnant, may be pregnant, or are planning to become pregnant.  If you drink alcohol: ? Limit how much you use to:  0-1 drink a day for women.  0-2 drinks a day for men. ? Be aware of how much alcohol is in your drink. In  the [place] one drink equals one 12 oz bottle of beer (355 mL), one 5 oz glass of wine (148 mL), or one 1 oz glass of hard liquor (44 mL). Lifestyle   Work with your doctor to stay at a healthy weight or to lose weight. Ask your doctor what the best weight is for you.  Get at least 30 minutes of exercise most days of the week. This may include walking, swimming, or biking.  Get at least 30 minutes of exercise that strengthens your muscles (resistance exercise) at least 3 days a week. This may include lifting weights or doing Pilates.  Do not use any products that contain nicotine or tobacco, such as cigarettes, e-cigarettes, and chewing tobacco. If you need help quitting, ask your doctor.  Check your blood pressure at home as told by your doctor.  Keep all follow-up visits as told by your doctor. This is important. Medicines  Take over-the-counter and prescription medicines only as told by your doctor. Follow directions carefully.  Do not skip doses of blood pressure medicine. The medicine does not work as well if you skip doses. Skipping doses also puts you at risk for problems.  Ask your doctor about side effects or reactions to medicines that you should watch for. Contact a doctor if you:  Think you are having a reaction to the medicine you are taking.  Have headaches that keep coming back (recurring).  Feel dizzy.  Have swelling in your ankles.  Have trouble with your vision. Get help right away if you:  Get a very bad headache.  Start to feel mixed up (confused).  Feel weak or numb.  Feel faint.  Have very bad pain in your: ? Chest. ? Belly (abdomen).  Throw up more than once.  Have trouble breathing. Summary  Hypertension is another name for high blood pressure.  High blood pressure forces your heart to work harder to pump blood.  For most people, a normal blood pressure is less than 120/80.  Making healthy choices can help lower blood pressure. If your  blood pressure does not get lower with healthy choices, you may need to take medicine. This information is not intended to replace advice given to you by your health care provider. Make sure you discuss any questions you have with your health care provider. Document Revised: 03/12/2018 Document Reviewed: 03/12/2018 Elsevier Patient Education  2020 [employer].

## 2019-11-29 ENCOUNTER — Telehealth: Payer: Self-pay | Admitting: Family Medicine

## 2019-11-29 MED ORDER — HYDROCHLOROTHIAZIDE 25 MG PO TABS
25.0000 mg | ORAL_TABLET | Freq: Every day | ORAL | 3 refills | Status: DC
Start: 2019-11-29 — End: 2020-11-03

## 2019-11-29 NOTE — Telephone Encounter (Signed)
-----   Message from Verdia Kuba, [facility] sent at 11/27/2019  8:06 PM EDT ----- Regarding: Clinical Pharmacy Recommendations Hello Dr. Caryn Section,  I had a productive visit and found the following medication list corrections and discovered some patient driven continuity of care issues.  1. She has BP > 150/80 at office visits and home, is s/p CVA, and is not taking BP medication Recommendation: restart Maxzide 37.5-25mg  1 tab by mouth daily for hypertension  2. Please add aspirin 81mg  daily to the medication list per patient  3. Her chronic leg pain is not well controlled. The gabapentin increase had no effect and she ran out of naproxen. The Robaxin is effective but she only takes it twice daily which is a med list update. Recommendation: Acetaminophen 500mg  2 tab by mouth three times daily scheduled D/c naproxen due to abdominal pain Add Vitamin B12 44mcg daily to medication list per patient  4. She only did one round of Lamisil because she thought someone from [facility] would call her when it was time to do another round. She has refills and will restart this regimen.  5. Please remove oxybutynin from her medication list per patient  Thanks,  Sheppard Plumber, PharmD, [place], [place] [phone]

## 2019-12-08 ENCOUNTER — Other Ambulatory Visit: Payer: Self-pay | Admitting: Family Medicine

## 2019-12-08 DIAGNOSIS — M79604 Pain in right leg: Secondary | ICD-10-CM

## 2019-12-28 ENCOUNTER — Ambulatory Visit: Payer: Self-pay | Admitting: Pharmacist

## 2019-12-28 ENCOUNTER — Other Ambulatory Visit: Payer: Self-pay

## 2019-12-28 DIAGNOSIS — M542 Cervicalgia: Secondary | ICD-10-CM

## 2019-12-28 DIAGNOSIS — I10 Essential (primary) hypertension: Secondary | ICD-10-CM

## 2019-12-28 NOTE — Chronic Care Management (AMB) (Signed)
Chronic Care Management Pharmacy  Name: Alexis [name]  MRN: [id number] DOB: April 26, 1956  Chief Complaint/ HPI  Alexis [name] 33,  64 y.o. , female presents for their Follow-Up CCM visit with the clinical pharmacist via telephone due to COVID-19 Pandemic.  PCP : Birdie Sons, MD  Their chronic conditions include: HTN, HLD, pain  Office Visits: NA  Consult Visit: NA  Medications: Outpatient Encounter Medications as of 12/28/2019  Medication Sig  . Acetaminophen (TYLENOL PO) Take by mouth as needed.  Marland Kitchen aspirin EC 81 MG tablet Take 81 mg by mouth daily.  Marland Kitchen atorvastatin (LIPITOR) 20 MG tablet Take 1 tablet (20 mg total) by mouth daily.  Marland Kitchen gabapentin (NEURONTIN) 600 MG tablet Take 1 tablet by mouth twice daily  . hydrochlorothiazide (HYDRODIURIL) 25 MG tablet Take 1 tablet (25 mg total) by mouth daily.  Marland Kitchen lubiprostone (AMITIZA) 24 MCG capsule Take 24 mcg by mouth daily with breakfast.   . methocarbamol (ROBAXIN) 750 MG tablet Take 1 tablet (750 mg total) by mouth 3 (three) times daily.  Marland Kitchen omeprazole (PRILOSEC) 40 MG capsule Take 1 capsule by mouth once daily  . terbinafine (LAMISIL) 250 MG tablet Take 2 tablets (500 mg total) by mouth daily. Take for 1 week out of every 4 weeks (Patient not taking: Reported on 11/27/2019)  . vitamin B-12 (CYANOCOBALAMIN) 50 MCG tablet Take 50 mcg by mouth daily.  . Vitamin D, Ergocalciferol, (DRISDOL) 50000 units CAPS capsule Take 1 capsule (50,000 Units total) by mouth every 7 (seven) days.   No facility-administered encounter medications on file as of 12/28/2019.      Financial Resource Strain: Low Risk   . Difficulty of Paying Living Expenses: Not hard at all   Current Diagnosis/Assessment:  Goals Addressed            This Visit's Progress   . Chronic Pain - improve control        CARE PLAN ENTRY (see longitudinal plan of care for additional care plan information)  Current Barriers:  . Chronic leg and sternal pain with multiple  comorbidities including hypertension, hyperlipidemia . Self-manages medications with treatment gaps   Pharmacist Clinical Goal(s):  Marland Kitchen Over the next 90 days, patient will work with PharmD and provider towards optimized medication management  Interventions: . Comprehensive medication review performed; medication list updated in electronic medical record . Inter-disciplinary care team collaboration (see longitudinal plan of care) . Tylenol 1000mg  three times daily scheduled . Increase gabapentin 800mg  twice daily  Patient Self Care Activities:  . Patient will take medications as prescribed . Patient will focus on improved adherence by taking Tylenol 1000mg  three times daily . Stop taking naproxen and ibuprofen  Initial goal documentation       Hypertension   Office blood pressures are  BP Readings from Last 3 Encounters:  09/30/19 (!) 150/90  05/29/19 (!) 158/91  02/23/19 124/84    Patient has failed these meds in the past: NA  Patient checks BP at home infrequently  Patient home BP readings are ranging: NA  We discussed: Taking new HCTZ? Yes, tolerating well  Plan  Continue current medications   Pain    Patient has failed these meds in past: Lyrica - cost Patient is currently uncontrolled on the following medications: gabapentin, Robaxin  We discussed:  Pain? Tingling Scheduled Tylenol?  Robaxin still twice daily? As needed. Balance off Tier exception for Lyrica?   Plan Increase gabapentin 800mg  twice daily Reduce Robaxin use Increase gabapentin to 800mg   twice daily  Medication Management   Pt uses [facility] for all medications Uses pill box? Yes Pt endorses 80% compliance Disadvantaged to [employer]  We discussed:  OTC benefit - none at this level Continued Lamisil? No, fungus resolved  Needs personal trainer  Plan Follow up to prepare pt for [facility] in October Continue current medication management strategy  Follow up: 3 month phone  visit  Milus Height, PharmD, [place], [place] [phone]

## 2019-12-29 ENCOUNTER — Telehealth: Payer: Self-pay | Admitting: Family Medicine

## 2019-12-29 DIAGNOSIS — M79604 Pain in right leg: Secondary | ICD-10-CM

## 2019-12-29 MED ORDER — GABAPENTIN 800 MG PO TABS: 800.0000 mg | ORAL_TABLET | Freq: Two times a day (BID) | ORAL | 3 refills | Status: DC

## 2019-12-29 NOTE — Patient Instructions (Addendum)
Visit Information  Goals Addressed            This Visit's Progress   . Chronic Pain - improve control        CARE PLAN ENTRY (see longitudinal plan of care for additional care plan information)  Current Barriers:  . Chronic leg and sternal pain with multiple comorbidities including hypertension, hyperlipidemia . Self-manages medications with treatment gaps   Pharmacist Clinical Goal(s):  Marland Kitchen Over the next 90 days, patient will work with PharmD and provider towards optimized medication management  Interventions: . Comprehensive medication review performed; medication list updated in electronic medical record . Inter-disciplinary care team collaboration (see longitudinal plan of care) . Tylenol 1000mg  three times daily scheduled . Increase gabapentin 800mg  twice daily  Patient Self Care Activities:  . Patient will take medications as prescribed . Patient will focus on improved adherence by taking Tylenol 1000mg  three times daily . Stop taking naproxen and ibuprofen  Initial goal documentation        Print copy of patient instructions provided.   Telephone follow up appointment with pharmacy team member scheduled for: 3 months  Milus Height, PharmD, [place], [place] [phone]   Chronic Back Pain When back pain lasts longer than 3 months, it is called chronic back pain. Pain may get worse at certain times (flare-ups). There are things you can do at home to manage your pain. Follow these instructions at home: Activity      Avoid bending and other activities that make pain worse.  When standing: ? Keep your upper back and neck straight. ? Keep your shoulders pulled back. ? Avoid slouching.  When sitting: ? Keep your back straight. ? Relax your shoulders. Do not round your shoulders or pull them backward.  Do not sit or stand in one place for long periods of time.  Take short rest breaks during the day. Lying down or  standing is usually better than sitting. Resting can help relieve pain.  When sitting or lying down for a long time, do some mild activity or stretching. This will help to prevent stiffness and pain.  Get regular exercise. Ask your doctor what activities are safe for you.  Do not lift anything that is heavier than 10 lb (4.5 kg). To prevent injury when you lift things: ? Bend your knees. ? Keep the weight close to your body. ? Avoid twisting. Managing pain  If told, put ice on the painful area. Your doctor may tell you to use ice for 24-48 hours after a flare-up starts. ? Put ice in a plastic bag. ? Place a towel between your skin and the bag. ? Leave the ice on for 20 minutes, 2-3 times a day.  If told, put heat on the painful area as often as told by your doctor. Use the heat source that your doctor recommends, such as a moist heat pack or a heating pad. ? Place a towel between your skin and the heat source. ? Leave the heat on for 20-30 minutes. ? Remove the heat if your skin turns bright red. This is especially important if you are unable to feel pain, heat, or cold. You may have a greater risk of getting burned.  Soak in a warm bath. This can help relieve pain.  Take over-the-counter and prescription medicines only as told by your doctor. General instructions  Sleep on a firm mattress. Try lying on your side with your knees slightly bent. If you lie on  your back, put a pillow under your knees.  Keep all follow-up visits as told by your doctor. This is important. Contact a doctor if:  You have pain that does not get better with rest or medicine. Get help right away if:  One or both of your arms or legs feel weak.  One or both of your arms or legs lose feeling (numbness).  You have trouble controlling when you poop (bowel movement) or pee (urinate).  You feel sick to your stomach (nauseous).  You throw up (vomit).  You have belly (abdominal) pain.  You have shortness  of breath.  You pass out (faint). Summary  When back pain lasts longer than 3 months, it is called chronic back pain.  Pain may get worse at certain times (flare-ups).  Use ice and heat as told by your doctor. Your doctor may tell you to use ice after flare-ups. This information is not intended to replace advice given to you by your health care provider. Make sure you discuss any questions you have with your health care provider. Document Revised: 10/23/2018 Document Reviewed: 02/14/2017 Elsevier Patient Education  2020 [employer].

## 2019-12-29 NOTE — Telephone Encounter (Signed)
-----   Message from Verdia Kuba, [facility] sent at 12/29/2019 10:28 AM EDT ----- Regarding: Clinical Pharmacy Follow Up Recommendations Hello Dr. Caryn Section,  Ms Mcclees is tolerating her new HCTZ very well but is still having tingling and numbness type pain. Feels unsteady on Robaxin. Gabapentin 600mg  bid. Recommendation: Patient to decrease Robaxin as tolerated Increase gabapentin 800mg  1 cap by mouth twice daily for neuropathic pain   Thanks,  Sheppard Plumber, PharmD, [place], [place] [phone]

## 2020-03-03 ENCOUNTER — Telehealth: Payer: Self-pay

## 2020-03-03 NOTE — Progress Notes (Signed)
Established patient visit   Patient: Alexis [name]   DOB: 09/09/1955   64 y.o. Female  MRN: [id number] Visit Date: 03/04/2020  Today's healthcare provider: Trinna Post, PA-C   Chief Complaint  Patient presents with  . Dysuria   Subjective    HPI  Patient presents today with burning on urination X 1 week. She denies hematuria. Denies nausea, fevers, chills, abdominal or back pain. She has only taken Tylenol for her symptoms. She also reports urinary frequency. No vaginal discharge or vaginal bleeding. No pelvic pain. She doesn't remember how to get on [place] and would like to be called with results.       Medications: Outpatient Medications Prior to Visit  Medication Sig  . Acetaminophen (TYLENOL PO) Take by mouth as needed.  Marland Kitchen aspirin EC 81 MG tablet Take 81 mg by mouth daily.  Marland Kitchen atorvastatin (LIPITOR) 20 MG tablet Take 1 tablet (20 mg total) by mouth daily.  Marland Kitchen gabapentin (NEURONTIN) 800 MG tablet Take 1 tablet (800 mg total) by mouth 2 (two) times daily.  . hydrochlorothiazide (HYDRODIURIL) 25 MG tablet Take 1 tablet (25 mg total) by mouth daily.  Marland Kitchen lubiprostone (AMITIZA) 24 MCG capsule Take 24 mcg by mouth daily with breakfast.   . methocarbamol (ROBAXIN) 750 MG tablet Take 1 tablet (750 mg total) by mouth 3 (three) times daily.  Marland Kitchen omeprazole (PRILOSEC) 40 MG capsule Take 1 capsule by mouth once daily  . vitamin B-12 (CYANOCOBALAMIN) 50 MCG tablet Take 50 mcg by mouth daily.  . Vitamin D, Ergocalciferol, (DRISDOL) 50000 units CAPS capsule Take 1 capsule (50,000 Units total) by mouth every 7 (seven) days.  Marland Kitchen terbinafine (LAMISIL) 250 MG tablet Take 2 tablets (500 mg total) by mouth daily. Take for 1 week out of every 4 weeks (Patient not taking: Reported on 11/27/2019)   No facility-administered medications prior to visit.    Review of Systems  Constitutional: Negative.   Gastrointestinal: Negative for abdominal pain.  Genitourinary: Positive for dysuria and  frequency. Negative for hematuria.      Objective    BP (!) 142/79   Pulse 63   Temp 98.5 F (36.9 C)   Ht 5' 5.5" (1.664 m)   Wt 193 lb (87.5 kg)   BMI 31.63 kg/m    Physical Exam Constitutional:      General: She is not in acute distress.    Appearance: Normal appearance. She is well-developed. She is not diaphoretic.  Cardiovascular:     Rate and Rhythm: Normal rate and regular rhythm.  Pulmonary:     Effort: Pulmonary effort is normal.     Breath sounds: Normal breath sounds.  Abdominal:     General: Bowel sounds are normal. There is no distension.     Palpations: Abdomen is soft.     Tenderness: There is abdominal tenderness in the suprapubic area. There is no guarding or rebound.  Skin:    General: Skin is warm and dry.  Neurological:     Mental Status: She is alert and oriented to person, place, and time. Mental status is at baseline.  Psychiatric:        Mood and Affect: Mood normal.        Behavior: Behavior normal.       Results for orders placed or performed in visit on 03/04/20  POCT urinalysis dipstick  Result Value Ref Range   Color, UA yellow    Clarity, UA cloudy    Glucose,  UA Negative Negative   Bilirubin, UA negative    Ketones, UA negative    Spec Grav, UA 1.025 1.010 - 1.025   Blood, UA non hemolyzed moderate    pH, UA 7.0 5.0 - 8.0   Protein, UA Positive (A) Negative   Urobilinogen, UA 0.2 0.2 or 1.0 E.U./dL   Nitrite, UA positive    Leukocytes, UA Moderate (2+) (A) Negative    Assessment & Plan    1. Cystitis  - sulfamethoxazole-trimethoprim (BACTRIM DS) 800-160 MG tablet; Take 1 tablet by mouth 2 (two) times daily for 3 days.  Dispense: 6 tablet; Refill: 0  2. Dysuria  - POCT urinalysis dipstick - Urinalysis, Routine w reflex microscopic - Urine Culture    Return if symptoms worsen or fail to improve.      ITrinna Post, PA-C, have reviewed all documentation for this visit. The documentation on 03/04/20 for the  exam, diagnosis, procedures, and orders are all accurate and complete. The entirety of the information documented in the History of Present Illness, Review of Systems and Physical Exam were personally obtained by me. Portions of this information were initially documented by [place] and reviewed by me for thoroughness and accuracy.        Paulene Floor  [facility] [phone] (phone) [phone] (fax)  [place]

## 2020-03-03 NOTE — Telephone Encounter (Signed)
Copied from [place] [id number]. Topic: General - Other >> Mar 03, 2020  8:32 AM Leward Quan A wrote: Reason for CRM: Patient called to say that she is having some pain when she urinate. Asking Dr if she can get an Rx sent to the pharmacy because she is going out of town tomorrow around 12 PM and need it before then Please advise Ph# [phone]

## 2020-03-03 NOTE — Telephone Encounter (Signed)
Called patient and advised her appointment is needed before any medication can be prescribed. Patient scheduled appointment for tomorrow at 8:20 AM.

## 2020-03-04 ENCOUNTER — Encounter: Payer: Self-pay | Admitting: Physician Assistant

## 2020-03-04 ENCOUNTER — Other Ambulatory Visit: Payer: Self-pay

## 2020-03-04 ENCOUNTER — Ambulatory Visit (INDEPENDENT_AMBULATORY_CARE_PROVIDER_SITE_OTHER): Payer: Medicare HMO | Admitting: Physician Assistant

## 2020-03-04 VITALS — BP 142/79 | HR 63 | Temp 98.5°F | Ht 65.5 in | Wt 193.0 lb

## 2020-03-04 DIAGNOSIS — R300 Dysuria: Secondary | ICD-10-CM | POA: Diagnosis not present

## 2020-03-04 DIAGNOSIS — N3090 Cystitis, unspecified without hematuria: Secondary | ICD-10-CM | POA: Diagnosis not present

## 2020-03-04 LAB — POCT URINALYSIS DIPSTICK
Bilirubin, UA: NEGATIVE
Glucose, UA: NEGATIVE
Ketones, UA: NEGATIVE
Nitrite, UA: POSITIVE
Protein, UA: POSITIVE — AB
Spec Grav, UA: 1.025 (ref 1.010–1.025)
Urobilinogen, UA: 0.2 E.U./dL
pH, UA: 7 (ref 5.0–8.0)

## 2020-03-04 MED ORDER — SULFAMETHOXAZOLE-TRIMETHOPRIM 800-160 MG PO TABS: 1.0000 | ORAL_TABLET | Freq: Two times a day (BID) | ORAL | 0 refills | Status: AC

## 2020-03-04 NOTE — Patient Instructions (Signed)

## 2020-03-05 LAB — MICROSCOPIC EXAMINATION

## 2020-03-05 LAB — URINALYSIS, ROUTINE W REFLEX MICROSCOPIC
Bilirubin, UA: NEGATIVE
Glucose, UA: NEGATIVE
Ketones, UA: NEGATIVE
Nitrite, UA: POSITIVE — AB
RBC, UA: NEGATIVE
Specific Gravity, UA: 1.024 (ref 1.005–1.030)
Urobilinogen, Ur: 1 mg/dL (ref 0.2–1.0)
pH, UA: >=9 — AB (ref 5.0–7.5)

## 2020-03-08 LAB — URINE CULTURE

## 2020-03-09 ENCOUNTER — Other Ambulatory Visit: Payer: Self-pay | Admitting: Physician Assistant

## 2020-03-09 DIAGNOSIS — N3000 Acute cystitis without hematuria: Secondary | ICD-10-CM

## 2020-03-09 MED ORDER — CEPHALEXIN 500 MG PO CAPS: 500.0000 mg | ORAL_CAPSULE | Freq: Two times a day (BID) | ORAL | 0 refills | Status: DC

## 2020-03-09 NOTE — Progress Notes (Signed)
Keflex sent for uti

## 2020-03-10 ENCOUNTER — Telehealth: Payer: Self-pay

## 2020-03-10 NOTE — Telephone Encounter (Signed)
-----   Message from Mar Daring, [place] sent at 03/09/2020  5:23 PM EDT ----- [name],  I am reviewing labs for Alexis [name] as she is out of town.  Your urine culture grew out a proteus bacteria that is resistant to the antibiotic you were placed on. Please stop the bactrim antibiotic.  I will send in keflex to treat the infection based on the culture.   Please call the office if you have any questions or if symptoms persist.  Grace Bushy, [facility]

## 2020-03-10 NOTE — Telephone Encounter (Signed)
Patient viewed message via MyChart and states that she is out of town but will have the Keflex tomorrow, 03/11/20 to start taking it.

## 2020-04-12 ENCOUNTER — Encounter: Payer: Self-pay | Admitting: Physician Assistant

## 2020-04-12 ENCOUNTER — Other Ambulatory Visit: Payer: Self-pay | Admitting: Family Medicine

## 2020-04-12 ENCOUNTER — Telehealth: Payer: Self-pay | Admitting: Family Medicine

## 2020-04-12 ENCOUNTER — Ambulatory Visit (INDEPENDENT_AMBULATORY_CARE_PROVIDER_SITE_OTHER): Payer: Medicare HMO | Admitting: Physician Assistant

## 2020-04-12 ENCOUNTER — Other Ambulatory Visit: Payer: Self-pay | Admitting: Physician Assistant

## 2020-04-12 ENCOUNTER — Other Ambulatory Visit: Payer: Self-pay

## 2020-04-12 VITALS — BP 130/74 | HR 77 | Temp 98.4°F | Wt 191.2 lb

## 2020-04-12 DIAGNOSIS — N3090 Cystitis, unspecified without hematuria: Secondary | ICD-10-CM | POA: Diagnosis not present

## 2020-04-12 DIAGNOSIS — Z1231 Encounter for screening mammogram for malignant neoplasm of breast: Secondary | ICD-10-CM

## 2020-04-12 DIAGNOSIS — R300 Dysuria: Secondary | ICD-10-CM | POA: Diagnosis not present

## 2020-04-12 LAB — POCT URINALYSIS DIPSTICK
Bilirubin, UA: NEGATIVE
Glucose, UA: NEGATIVE
Ketones, UA: NEGATIVE
Nitrite, UA: POSITIVE
Protein, UA: POSITIVE — AB
Spec Grav, UA: 1.01 (ref 1.010–1.025)
Urobilinogen, UA: >=8 E.U./dL — AB
pH, UA: 8 (ref 5.0–8.0)

## 2020-04-12 MED ORDER — CEPHALEXIN 500 MG PO CAPS: 500.0000 mg | ORAL_CAPSULE | Freq: Two times a day (BID) | ORAL | 0 refills | Status: AC

## 2020-04-12 NOTE — Progress Notes (Signed)
Established patient visit   Patient: Alexis [name]   DOB: 04/11/1956   64 y.o. Female  MRN: [id number] Visit Date: 04/12/2020  Today's healthcare provider: Trinna Post, PA-C   Chief Complaint  Patient presents with  . Dysuria  I,Porsha C McClurkin,acting as a scribe for Trinna Post, PA-C.,have documented all relevant documentation on the behalf of Trinna Post, PA-C,as directed by  Trinna Post, PA-C while in the presence of Trinna Post, PA-C.  Subjective    Dysuria  This is a recurrent problem. The current episode started 1 to 4 weeks ago. The problem occurs every urination. The problem has been unchanged. The quality of the pain is described as burning and aching. The pain is at a severity of 4/10. The pain is mild. There has been no fever. Associated symptoms include frequency, hesitancy and urgency. Pertinent negatives include no discharge, flank pain or nausea. She has tried antibiotics and increased fluids for the symptoms. The treatment provided mild relief. Her past medical history is significant for recurrent UTIs.    Patient has had three UTIs in the past year.      Medications: Outpatient Medications Prior to Visit  Medication Sig  . Acetaminophen (TYLENOL PO) Take by mouth as needed.  Marland Kitchen aspirin EC 81 MG tablet Take 81 mg by mouth daily.  Marland Kitchen atorvastatin (LIPITOR) 20 MG tablet Take 1 tablet (20 mg total) by mouth daily.  Marland Kitchen gabapentin (NEURONTIN) 800 MG tablet Take 1 tablet (800 mg total) by mouth 2 (two) times daily.  . hydrochlorothiazide (HYDRODIURIL) 25 MG tablet Take 1 tablet (25 mg total) by mouth daily.  Marland Kitchen lubiprostone (AMITIZA) 24 MCG capsule Take 24 mcg by mouth daily with breakfast.   . methocarbamol (ROBAXIN) 750 MG tablet Take 1 tablet (750 mg total) by mouth 3 (three) times daily.  Marland Kitchen omeprazole (PRILOSEC) 40 MG capsule Take 1 capsule by mouth once daily  . terbinafine (LAMISIL) 250 MG tablet Take 2 tablets (500 mg total) by mouth  daily. Take for 1 week out of every 4 weeks  . vitamin B-12 (CYANOCOBALAMIN) 50 MCG tablet Take 50 mcg by mouth daily.  . Vitamin D, Ergocalciferol, (DRISDOL) 50000 units CAPS capsule Take 1 capsule (50,000 Units total) by mouth every 7 (seven) days.  . [DISCONTINUED] cephALEXin (KEFLEX) 500 MG capsule Take 1 capsule (500 mg total) by mouth 2 (two) times daily. (Patient not taking: Reported on 04/12/2020)   No facility-administered medications prior to visit.    Review of Systems  Gastrointestinal: Negative for abdominal pain and nausea.  Genitourinary: Positive for dysuria, frequency, hesitancy and urgency. Negative for difficulty urinating, flank pain, pelvic pain, vaginal bleeding, vaginal discharge and vaginal pain.  Musculoskeletal: Negative for back pain.      Objective    BP 130/74 (BP Location: Left Arm, Patient Position: Sitting, Cuff Size: Large)   Pulse 77   Temp 98.4 F (36.9 C) (Oral)   Wt 191 lb 3.2 oz (86.7 kg)   SpO2 98%   BMI 31.33 kg/m    Physical Exam Constitutional:      Appearance: Normal appearance. She is normal weight.  Cardiovascular:     Rate and Rhythm: Normal rate and regular rhythm.     Heart sounds: Normal heart sounds.  Pulmonary:     Effort: Pulmonary effort is normal.     Breath sounds: Normal breath sounds.  Skin:    General: Skin is warm and dry.  Neurological:  General: No focal deficit present.     Mental Status: She is alert and oriented to person, place, and time. Mental status is at baseline.  Psychiatric:        Mood and Affect: Mood normal.        Behavior: Behavior normal.       Results for orders placed or performed in visit on 04/12/20  POCT urinalysis dipstick  Result Value Ref Range   Color, UA Dark Yellow    Clarity, UA Clear    Glucose, UA Negative Negative   Bilirubin, UA Negative    Ketones, UA Negative    Spec Grav, UA 1.010 1.010 - 1.025   Blood, UA Trace    pH, UA 8.0 5.0 - 8.0   Protein, UA Positive  (A) Negative   Urobilinogen, UA >=8.0 (A) 0.2 or 1.0 E.U./dL   Nitrite, UA Positive    Leukocytes, UA Large (3+) (A) Negative   Appearance     Odor      Assessment & Plan    1. Cystitis  Acute on chronic. Treat as below. Likely due to estrogen deficiency, encouraged to discuss vaginal estrogen PRN for prevention.   - POCT urinalysis dipstick - Urine Culture - Urine Microscopic - cephALEXin (KEFLEX) 500 MG capsule; Take 1 capsule (500 mg total) by mouth 2 (two) times daily for 5 days.  Dispense: 10 capsule; Refill: 0  2. Recurrent cystitis   3. Encounter for screening mammogram for malignant neoplasm of breast  She is due for mammogram and I have ordered this for her. Provided with contact card and instructed how to self schedule.   - MM Digital Screening; Future     No follow-ups on file.      ITrinna Post, PA-C, have reviewed all documentation for this visit. The documentation on 04/13/20 for the exam, diagnosis, procedures, and orders are all accurate and complete.  The entirety of the information documented in the History of Present Illness, Review of Systems and Physical Exam were personally obtained by me. Portions of this information were initially documented by [facility] and reviewed by me for thoroughness and accuracy.     Paulene Floor  [facility] [phone] (phone) [phone] (fax)  [place]

## 2020-04-12 NOTE — Telephone Encounter (Signed)
Copied from [place] [id number]. Topic: Referral - Request for Referral >> Apr 11, 2020  9:52 AM Alanda Slim E wrote: Has patient seen PCP for this complaint? No  *If NO, is insurance requiring patient see PCP for this issue before PCP can refer them? Referral for which specialty: Mammogram Preferred provider/office:  Reason for referral: Pt fell 2 weeks ago on her breast / breast are still sore/ Please call Pt when sent

## 2020-04-13 LAB — URINALYSIS, MICROSCOPIC ONLY
Casts: NONE SEEN /lpf
Epithelial Cells (non renal): >10 /hpf — AB (ref 0–10)
RBC, Urine: NONE SEEN /hpf (ref 0–2)

## 2020-04-13 NOTE — Telephone Encounter (Signed)
Patient seen by Fabio Bering yesterday and mammogram ordered during that office visit.

## 2020-04-15 LAB — URINE CULTURE

## 2020-04-18 ENCOUNTER — Ambulatory Visit: Payer: Self-pay | Admitting: Pharmacist

## 2020-04-18 ENCOUNTER — Other Ambulatory Visit: Payer: Self-pay

## 2020-04-18 DIAGNOSIS — I10 Essential (primary) hypertension: Secondary | ICD-10-CM

## 2020-04-18 DIAGNOSIS — M5030 Other cervical disc degeneration, unspecified cervical region: Secondary | ICD-10-CM

## 2020-04-18 NOTE — Chronic Care Management (AMB) (Signed)
Chronic Care Management Pharmacy  Name: Alexis [name]  MRN: [id number] DOB: 05-Apr-1956   Chief Complaint/ HPI  Alexis [name] 38,  64 y.o. , female presents for their Follow-Up CCM visit with the clinical pharmacist In office.  PCP : Birdie Sons, MD  Their chronic conditions include: Chronic Pain, GERD, HLD  Office Visits:NA  Consult Visit:NA  Medications: Outpatient Encounter Medications as of 04/18/2020  Medication Sig  . lubiprostone (AMITIZA) 24 MCG capsule Take 24 mcg by mouth daily with breakfast.   . Acetaminophen (TYLENOL PO) Take by mouth as needed.  Marland Kitchen aspirin EC 81 MG tablet Take 81 mg by mouth daily.  Marland Kitchen atorvastatin (LIPITOR) 20 MG tablet Take 1 tablet (20 mg total) by mouth daily.  Marland Kitchen gabapentin (NEURONTIN) 800 MG tablet Take 1 tablet (800 mg total) by mouth 2 (two) times daily.  . hydrochlorothiazide (HYDRODIURIL) 25 MG tablet Take 1 tablet (25 mg total) by mouth daily.  . methocarbamol (ROBAXIN) 750 MG tablet Take 1 tablet (750 mg total) by mouth 3 (three) times daily.  Marland Kitchen omeprazole (PRILOSEC) 40 MG capsule Take 1 capsule by mouth once daily  . terbinafine (LAMISIL) 250 MG tablet Take 2 tablets (500 mg total) by mouth daily. Take for 1 week out of every 4 weeks  . vitamin B-12 (CYANOCOBALAMIN) 50 MCG tablet Take 50 mcg by mouth daily.  . Vitamin D, Ergocalciferol, (DRISDOL) 50000 units CAPS capsule Take 1 capsule (50,000 Units total) by mouth every 7 (seven) days.   No facility-administered encounter medications on file as of 04/18/2020.     Financial Resource Strain: Low Risk   . Difficulty of Paying Living Expenses: Not hard at all     Current Diagnosis/Assessment:  Goals Addressed            This Visit's Progress   . Chronic Pain - improve control        CARE PLAN ENTRY (see longitudinal plan of care for additional care plan information)  Current Barriers:  . Chronic leg and sternal pain with multiple comorbidities including hypertension,  hyperlipidemia . Self-manages medications with treatment gaps   Pharmacist Clinical Goal(s):  Marland Kitchen Over the next 90 days, patient will work with PharmD and provider towards optimized medication management  Interventions: . Comprehensive medication review performed; medication list updated in electronic medical record . Inter-disciplinary care team collaboration (see longitudinal plan of care) . Tylenol 1000mg  three times daily scheduled . Research copay cost of Lyrica  Patient Self Care Activities:  . Patient will take medications as prescribed . Patient will focus on improved adherence by taking Tylenol 1000mg  three times daily . Stop taking naproxen and ibuprofen  Initial goal documentation     . Hypertension - goal BP < 140/90       CARE PLAN ENTRY (see longitudinal plan of care for additional care plan information)  Current Barriers:  . Uncontrolled hypertension, complicated by medication awareness  . Current antihypertensive regimen: None . Previous antihypertensives tried: Maxzide was effective and well tolerated . Last practice recorded BP readings:  BP Readings from Last 3 Encounters:  09/30/19 (!) 150/90  05/29/19 (!) 158/91  02/23/19 124/84 .   Marland Kitchen Current home BP readings: normal . More recent office readings at goal. Kidney Function Lab Results  Component Value Date/Time   CREATININE 0.99 02/11/2019 10:58 AM   CREATININE 0.83 12/26/2017 10:56 AM   CREATININE 0.83 06/02/2012 11:08 AM   GFRNONAA 60 02/11/2019 10:58 AM   GFRAA 69 02/11/2019 10:58  AM    Pharmacist Clinical Goal(s):  Marland Kitchen Over the next 90 days, patient will work with PharmD and providers to restart antihypertensive regimen  Interventions: . Inter-disciplinary care team collaboration (see longitudinal plan of care) . Comprehensive medication review performed; medication list updated in the electronic medical record.   Patient Self Care Activities:  . Patient will continue to check BP daily ,  document, and provide at future appointments . Patient will focus on medication adherence by taking Maxzide daily  Initial goal documentation       Hypertension   BP goal is:  <140/90  Office blood pressures are  BP Readings from Last 3 Encounters:  04/12/20 130/74  03/04/20 (!) 142/79  09/30/19 (!) 150/90   Patient checks BP at home infrequently Patient home BP readings are ranging: NA  Patient has failed these meds in the past: NA Patient is currently controlled on the following medications:  . Hydrochlorothiazide 25mg  daily  We discussed: At goal Denies hypotension Does get dizzy but readings are not low. Likely not BP related  Plan  Continue current medications   Cervical Pain   Patient has failed these meds in past: NA Patient is currently uncontrolled on the following medications:  . Gabapentin 800mg  bid, Tylenol, Robaxin  We discussed:   Doesn't have any Robaxin Drags left leg Neck surgery responsible for neuropathic pain Gabapentin increase not effective Shoulder OA Had to stop ibuprofen due to stomach problems, counseled on APAP Check on Lyrica cost with [employer]  Plan  Recommend changel to Lyrica if copay acceptable Increase as needed Tylenol usage Continue current medications   Medication Management   Pt uses [place] for all medications Uses pill box? Yes Pt endorses 100% compliance  We discussed:   Goes to gym 4 - 5 times weekly Treadmill, small dumbells, has friends  Plan  Continue current medication management strategy  Follow up: 3 month phone visit  Milus Height, PharmD, [place], [facility] [phone]

## 2020-04-19 ENCOUNTER — Ambulatory Visit
Admission: RE | Admit: 2020-04-19 | Discharge: 2020-04-19 | Disposition: A | Discharge status: Home / Self Care | Payer: Medicare HMO | Source: Ambulatory Visit | Attending: Physician Assistant | Admitting: Physician Assistant

## 2020-04-19 DIAGNOSIS — Z1231 Encounter for screening mammogram for malignant neoplasm of breast: Secondary | ICD-10-CM | POA: Insufficient documentation

## 2020-04-19 NOTE — Patient Instructions (Signed)
Visit Information  Goals Addressed            This Visit's Progress   . Chronic Pain - improve control        CARE PLAN ENTRY (see longitudinal plan of care for additional care plan information)  Current Barriers:  . Chronic leg and sternal pain with multiple comorbidities including hypertension, hyperlipidemia . Self-manages medications with treatment gaps   Pharmacist Clinical Goal(s):  Marland Kitchen Over the next 90 days, patient will work with PharmD and provider towards optimized medication management  Interventions: . Comprehensive medication review performed; medication list updated in electronic medical record . Inter-disciplinary care team collaboration (see longitudinal plan of care) . Tylenol 1000mg  three times daily scheduled . Research copay cost of Lyrica  Patient Self Care Activities:  . Patient will take medications as prescribed . Patient will focus on improved adherence by taking Tylenol 1000mg  three times daily . Stop taking naproxen and ibuprofen  Initial goal documentation     . Hypertension - goal BP < 140/90       CARE PLAN ENTRY (see longitudinal plan of care for additional care plan information)  Current Barriers:  . Uncontrolled hypertension, complicated by medication awareness  . Current antihypertensive regimen: None . Previous antihypertensives tried: Maxzide was effective and well tolerated . Last practice recorded BP readings:  BP Readings from Last 3 Encounters:  09/30/19 (!) 150/90  05/29/19 (!) 158/91  02/23/19 124/84 .   Marland Kitchen Current home BP readings: normal . More recent office readings at goal. Kidney Function Lab Results  Component Value Date/Time   CREATININE 0.99 02/11/2019 10:58 AM   CREATININE 0.83 12/26/2017 10:56 AM   CREATININE 0.83 06/02/2012 11:08 AM   GFRNONAA 60 02/11/2019 10:58 AM   GFRAA 69 02/11/2019 10:58 AM    Pharmacist Clinical Goal(s):  Marland Kitchen Over the next 90 days, patient will work with PharmD and providers to restart  antihypertensive regimen  Interventions: . Inter-disciplinary care team collaboration (see longitudinal plan of care) . Comprehensive medication review performed; medication list updated in the electronic medical record.   Patient Self Care Activities:  . Patient will continue to check BP daily , document, and provide at future appointments . Patient will focus on medication adherence by taking Maxzide daily  Initial goal documentation        Print copy of patient instructions provided.   Telephone follow up appointment with pharmacy team member scheduled for: 3 months  Milus Height, PharmD, [place], [facility] [phone]

## 2020-04-20 ENCOUNTER — Ambulatory Visit (INDEPENDENT_AMBULATORY_CARE_PROVIDER_SITE_OTHER): Payer: Medicare HMO | Admitting: Family Medicine

## 2020-04-20 ENCOUNTER — Encounter: Payer: Self-pay | Admitting: Family Medicine

## 2020-04-20 ENCOUNTER — Other Ambulatory Visit: Payer: Self-pay

## 2020-04-20 VITALS — BP 116/75 | HR 60 | Temp 98.6°F | Ht 65.5 in | Wt 194.0 lb

## 2020-04-20 DIAGNOSIS — I10 Essential (primary) hypertension: Secondary | ICD-10-CM | POA: Diagnosis not present

## 2020-04-20 DIAGNOSIS — N390 Urinary tract infection, site not specified: Secondary | ICD-10-CM

## 2020-04-20 DIAGNOSIS — Z86010 Personal history of colonic polyps: Secondary | ICD-10-CM | POA: Diagnosis not present

## 2020-04-20 DIAGNOSIS — E7800 Pure hypercholesterolemia, unspecified: Secondary | ICD-10-CM | POA: Diagnosis not present

## 2020-04-20 DIAGNOSIS — E559 Vitamin D deficiency, unspecified: Secondary | ICD-10-CM | POA: Diagnosis not present

## 2020-04-20 MED ORDER — ESTROGENS, CONJUGATED 0.625 MG/GM VA CREA: TOPICAL_CREAM | VAGINAL | 4 refills | Status: DC

## 2020-04-20 NOTE — Progress Notes (Signed)
Established patient visit   Patient: Alexis [name]   DOB: 06/14/1956   64 y.o. Female  MRN: [id number] Visit Date: 04/20/2020  Today's healthcare provider: Lelon Huh, MD   Chief Complaint  Patient presents with  . Hyperlipidemia  . Hypertension   Subjective    HPI   Hypertension, follow-up  BP Readings from Last 3 Encounters:  04/20/20 116/75  04/12/20 130/74  03/04/20 (!) 142/79   Wt Readings from Last 3 Encounters:  04/20/20 194 lb (88 kg)  04/12/20 191 lb 3.2 oz (86.7 kg)  03/04/20 193 lb (87.5 kg)     .  She reports excellent compliance with treatment. She is not having side effects.  She is following a Low Sodium diet. She is exercising. She does not smoke.  Use of agents associated with hypertension: none.   Outside blood pressures are not being checked. Symptoms: No chest pain No chest pressure  No palpitations No syncope  No dyspnea No orthopnea  No paroxysmal nocturnal dyspnea No lower extremity edema   Pertinent labs: Lab Results  Component Value Date   CHOL 135 02/11/2019   HDL 55 02/11/2019   LDLCALC 70 02/11/2019   TRIG 52 02/11/2019   CHOLHDL 2.5 02/11/2019   Lab Results  Component Value Date   NA 140 02/11/2019   K 4.5 02/11/2019   CREATININE 0.99 02/11/2019   GFRNONAA 60 02/11/2019   GFRAA 69 02/11/2019   GLUCOSE 91 02/11/2019     The ASCVD Risk score (Goff DC Jr., et al., 2013) failed to calculate for the following reasons:   The patient has a prior MI or stroke diagnosis   ---------------------------------------------------------------------------------------------------  Lipid/Cholesterol, Follow-up  Last lipid panel Other pertinent labs  Lab Results  Component Value Date   CHOL 135 02/11/2019   HDL 55 02/11/2019   LDLCALC 70 02/11/2019   TRIG 52 02/11/2019   CHOLHDL 2.5 02/11/2019   Lab Results  Component Value Date   ALT 18 02/11/2019   AST 24 02/11/2019   PLT 297 07/24/2016   TSH 0.962 02/11/2019      She was last seen for this 1 year ago.  Management since that visit includes no changes.  She reports excellent compliance with treatment. She is not having side effects.   Symptoms: No chest pain No chest pressure/discomfort  No dyspnea No lower extremity edema  No numbness or tingling of extremity No orthopnea  No palpitations No paroxysmal nocturnal dyspnea  No speech difficulty No syncope   Current diet: in general, a "healthy" diet     The ASCVD Risk score Mikey Bussing DC Jr., et al., 2013) failed to calculate for the following reasons:   The patient has a prior MI or stroke diagnosis  --------------------------------------------------------------------------------------------------- She has also been having recurrent UTIs treated by Adrianna twice in the last 6 weeks. Sx have resolved but she is concerned about why they keep coming back.     Medications: Outpatient Medications Prior to Visit  Medication Sig  . Acetaminophen (TYLENOL PO) Take by mouth as needed.  Marland Kitchen aspirin EC 81 MG tablet Take 81 mg by mouth daily.  Marland Kitchen atorvastatin (LIPITOR) 20 MG tablet Take 1 tablet (20 mg total) by mouth daily.  Marland Kitchen gabapentin (NEURONTIN) 800 MG tablet Take 1 tablet (800 mg total) by mouth 2 (two) times daily.  . hydrochlorothiazide (HYDRODIURIL) 25 MG tablet Take 1 tablet (25 mg total) by mouth daily.  Marland Kitchen lubiprostone (AMITIZA) 24 MCG capsule Take 24 mcg  by mouth daily with breakfast.   . methocarbamol (ROBAXIN) 750 MG tablet Take 1 tablet (750 mg total) by mouth 3 (three) times daily.  Marland Kitchen omeprazole (PRILOSEC) 40 MG capsule Take 1 capsule by mouth once daily  . terbinafine (LAMISIL) 250 MG tablet Take 2 tablets (500 mg total) by mouth daily. Take for 1 week out of every 4 weeks  . vitamin B-12 (CYANOCOBALAMIN) 50 MCG tablet Take 50 mcg by mouth daily.  . Vitamin D, Ergocalciferol, (DRISDOL) 50000 units CAPS capsule Take 1 capsule (50,000 Units total) by mouth every 7 (seven) days.   No  facility-administered medications prior to visit.    Review of Systems  Constitutional: Negative.   Respiratory: Negative.   Cardiovascular: Positive for leg swelling. Negative for chest pain and palpitations.  Gastrointestinal: Negative.   Neurological: Positive for headaches. Negative for dizziness and light-headedness.      Objective    BP 116/75 (BP Location: Right Arm, Patient Position: Sitting, Cuff Size: Large)   Pulse 60   Temp 98.6 F (37 C) (Oral)   Ht 5' 5.5" (1.664 m)   Wt 194 lb (88 kg)   BMI 31.79 kg/m    Physical Exam   General: Appearance:    Obese female in no acute distress  Eyes:    PERRL, conjunctiva/corneas clear, EOM's intact       Lungs:     Clear to auscultation bilaterally, respirations unlabored  Heart:    Normal heart rate. Normal rhythm. No murmurs, rubs, or gallops.   MS:   All extremities are intact.   Neurologic:   Awake, alert, oriented x 3. No apparent focal neurological           defect.         No results found for any visits on 04/20/20.  Assessment & Plan     1. Vitamin D deficiency  - VITAMIN D 25 Hydroxy (Vit-D Deficiency, Fractures)  2. Benign essential HTN Well controlled.  Continue current medications.   - Comprehensive metabolic panel  3. Hypercholesteremia She is tolerating atorvastatin well with no adverse effects.   - CBC - Comprehensive metabolic panel - Lipid panel  4. Chronic UTI Resolved with recent antibiotic. Will try- conjugated estrogens (PREMARIN) vaginal cream; Apply vaginally 2-3 times each week.  Dispense: 30 g; Refill: 4 for likely atrophic vaginitis.   Consider urology referral if UTI recur after being on vaginal estrogen for  3 months.   She declined flu vaccine     The entirety of the information documented in the History of Present Illness, Review of Systems and Physical Exam were personally obtained by me. Portions of this information were initially documented by the CMA and reviewed by me for  thoroughness and accuracy.       Lelon Huh, MD  [facility] [phone] (phone) [phone] (fax)  [place]

## 2020-04-20 NOTE — Patient Instructions (Addendum)
.   Please review the attached list of medications and notify my office if there are any errors.   . Please bring all of your medications to every appointment so we can make sure that our medication list is the same as yours.   

## 2020-04-21 ENCOUNTER — Telehealth: Payer: Self-pay

## 2020-04-21 NOTE — Progress Notes (Signed)
Information from Medicare.gov regarding Lyrica cost mailed to patient after 3 failed phone calls.

## 2020-04-25 DIAGNOSIS — E7800 Pure hypercholesterolemia, unspecified: Secondary | ICD-10-CM | POA: Diagnosis not present

## 2020-04-25 DIAGNOSIS — I10 Essential (primary) hypertension: Secondary | ICD-10-CM | POA: Diagnosis not present

## 2020-04-25 DIAGNOSIS — E559 Vitamin D deficiency, unspecified: Secondary | ICD-10-CM | POA: Diagnosis not present

## 2020-04-26 LAB — COMPREHENSIVE METABOLIC PANEL
ALT: 28 IU/L (ref 0–32)
AST: 27 IU/L (ref 0–40)
Albumin/Globulin Ratio: 1.5 (ref 1.2–2.2)
Albumin: 4.4 g/dL (ref 3.8–4.8)
Alkaline Phosphatase: 58 IU/L (ref 44–121)
BUN/Creatinine Ratio: 18 (ref 12–28)
BUN: 19 mg/dL (ref 8–27)
Bilirubin Total: 0.4 mg/dL (ref 0.0–1.2)
CO2: 26 mmol/L (ref 20–29)
Calcium: 10.1 mg/dL (ref 8.7–10.3)
Chloride: 100 mmol/L (ref 96–106)
Creatinine, Ser: 1.04 mg/dL — ABNORMAL HIGH (ref 0.57–1.00)
GFR calc Af Amer: 65 mL/min/{1.73_m2} (ref >59)
GFR calc non Af Amer: 56 mL/min/{1.73_m2} — ABNORMAL LOW (ref >59)
Globulin, Total: 2.9 g/dL (ref 1.5–4.5)
Glucose: 87 mg/dL (ref 65–99)
Potassium: 4 mmol/L (ref 3.5–5.2)
Sodium: 139 mmol/L (ref 134–144)
Total Protein: 7.3 g/dL (ref 6.0–8.5)

## 2020-04-26 LAB — LIPID PANEL
Chol/HDL Ratio: 2.9 ratio (ref 0.0–4.4)
Cholesterol, Total: 154 mg/dL (ref 100–199)
HDL: 54 mg/dL (ref >39)
LDL Chol Calc (NIH): 83 mg/dL (ref 0–99)
Triglycerides: 88 mg/dL (ref 0–149)
VLDL Cholesterol Cal: 17 mg/dL (ref 5–40)

## 2020-04-26 LAB — VITAMIN D 25 HYDROXY (VIT D DEFICIENCY, FRACTURES): Vit D, 25-Hydroxy: 34.3 ng/mL (ref 30.0–100.0)

## 2020-04-26 LAB — CBC
Hematocrit: 38.7 % (ref 34.0–46.6)
Hemoglobin: 13.2 g/dL (ref 11.1–15.9)
MCH: 29.1 pg (ref 26.6–33.0)
MCHC: 34.1 g/dL (ref 31.5–35.7)
MCV: 85 fL (ref 79–97)
Platelets: 326 10*3/uL (ref 150–450)
RBC: 4.53 x10E6/uL (ref 3.77–5.28)
RDW: 12.7 % (ref 11.7–15.4)
WBC: 7.5 10*3/uL (ref 3.4–10.8)

## 2020-05-08 ENCOUNTER — Other Ambulatory Visit: Payer: Self-pay | Admitting: Family Medicine

## 2020-05-08 DIAGNOSIS — M79604 Pain in right leg: Secondary | ICD-10-CM

## 2020-05-08 NOTE — Telephone Encounter (Signed)
Requested Prescriptions  Pending Prescriptions Disp Refills   gabapentin (NEURONTIN) 800 MG tablet [Pharmacy Med Name: Gabapentin 800 MG Oral Tablet] 180 tablet 3    Sig: Take 1 tablet by mouth twice daily     Neurology: Anticonvulsants - gabapentin Passed - 05/08/2020 11:38 AM      Passed - Valid encounter within last 12 months    Recent Outpatient Visits          2 weeks ago Vitamin D deficiency   [facility] Birdie Sons, MD   3 weeks ago [place], Wendee Beavers, [place]   2 months ago [place], [place], [place]   7 months ago Bursitis of right shoulder   [facility] Birdie Sons, MD   11 months ago Subacromial bursitis of right shoulder joint   [facility] Birdie Sons, MD

## 2020-05-10 ENCOUNTER — Telehealth: Payer: Self-pay | Admitting: Pharmacist

## 2020-05-10 NOTE — Progress Notes (Signed)
    Chronic Care Management Pharmacy Assistant   Name: Alexis [name]  MRN: [id number] DOB: [birth year]  Reason for Encounter: Medication Review  Patient Questions:  1.  Have you seen any other providers since your last visit? No  2.  Any changes in your medicines or health? No   Alexis [name],  64 y.o. , female presents for their Follow-Up CCM visit with the clinical pharmacist via telephone.  PCP : Birdie Sons, MD  Allergies:  No Known Allergies  Medications: Outpatient Encounter Medications as of 05/10/2020  Medication Sig  . Acetaminophen (TYLENOL PO) Take by mouth as needed.  Marland Kitchen aspirin EC 81 MG tablet Take 81 mg by mouth daily.  Marland Kitchen atorvastatin (LIPITOR) 20 MG tablet Take 1 tablet (20 mg total) by mouth daily.  Marland Kitchen conjugated estrogens (PREMARIN) vaginal cream Apply vaginally 2-3 times each week.  . gabapentin (NEURONTIN) 800 MG tablet Take 1 tablet by mouth twice daily  . hydrochlorothiazide (HYDRODIURIL) 25 MG tablet Take 1 tablet (25 mg total) by mouth daily.  Marland Kitchen lubiprostone (AMITIZA) 24 MCG capsule Take 24 mcg by mouth daily with breakfast.   . methocarbamol (ROBAXIN) 750 MG tablet Take 1 tablet (750 mg total) by mouth 3 (three) times daily.  Marland Kitchen omeprazole (PRILOSEC) 40 MG capsule Take 1 capsule by mouth once daily  . terbinafine (LAMISIL) 250 MG tablet Take 2 tablets (500 mg total) by mouth daily. Take for 1 week out of every 4 weeks  . vitamin B-12 (CYANOCOBALAMIN) 50 MCG tablet Take 50 mcg by mouth daily.  . Vitamin D, Ergocalciferol, (DRISDOL) 50000 units CAPS capsule Take 1 capsule (50,000 Units total) by mouth every 7 (seven) days.   No facility-administered encounter medications on file as of 05/10/2020.    Current Diagnosis: Patient Active Problem List   Diagnosis Date Noted  . History of kidney stones 02/11/2018  . Epigastric pain 07/24/2016  . Family history of early CAD 11/09/2015  . Contact with or exposure to communicable disease 11/09/2015  .  Degeneration of cervical intervertebral disc 11/09/2015  . Insomnia 11/09/2015  . Abnormal results of thyroid function studies 11/09/2015  . Asymptomatic varicose veins 11/09/2015  . Vitamin D deficiency 11/09/2015  . Abnormality of gait 11/09/2015  . History of adenomatous polyp of colon 02/28/2015  . Fibroids 01/04/2015  . Memory difficulty 01/04/2015  . Hearing loss   . Cerebrovascular disease   . Esophageal reflux   . Obstructive sleep apnea   . Urinary incontinence   . Depression   . LBP (low back pain) 12/07/2014  . Benign neoplasm of colon 10/13/2013  . Cervical pain 10/13/2013  . Chronic constipation 10/13/2013  . Benign essential HTN 10/13/2013  . Cephalalgia 10/13/2013  . Hypercholesteremia 10/13/2013  . Abnormal mental state 10/13/2013  . Cerebral artery occlusion with cerebral infarction [facility]) 12/03/2006    Goals Addressed   None     Follow-Up:  [facility]   Reviewed chart and adherence measures. Per insurance data pt is 100 % adherent with medication compliance.

## 2020-05-11 ENCOUNTER — Telehealth: Payer: Self-pay | Admitting: Pharmacist

## 2020-05-11 NOTE — Progress Notes (Signed)
    Chronic Care Management Pharmacy Assistant   Name: Alexis [name]  MRN: [id number] DOB: 06-Nov-1955  Reason for Encounter: Disease State and Medication Review  Patient Questions:  1.  Have you seen any other providers since your last visit? No  2.  Any changes in your medicines or health? No   Alexis [name],  64 y.o. , female presents for their Follow-Up CCM visit with the clinical pharmacist via telephone.  PCP : Birdie Sons, MD  Allergies:  No Known Allergies  Medications: Outpatient Encounter Medications as of 05/11/2020  Medication Sig  . Acetaminophen (TYLENOL PO) Take by mouth as needed.  Marland Kitchen aspirin EC 81 MG tablet Take 81 mg by mouth daily.  Marland Kitchen atorvastatin (LIPITOR) 20 MG tablet Take 1 tablet (20 mg total) by mouth daily.  Marland Kitchen conjugated estrogens (PREMARIN) vaginal cream Apply vaginally 2-3 times each week.  . gabapentin (NEURONTIN) 800 MG tablet Take 1 tablet by mouth twice daily  . hydrochlorothiazide (HYDRODIURIL) 25 MG tablet Take 1 tablet (25 mg total) by mouth daily.  Marland Kitchen lubiprostone (AMITIZA) 24 MCG capsule Take 24 mcg by mouth daily with breakfast.   . methocarbamol (ROBAXIN) 750 MG tablet Take 1 tablet (750 mg total) by mouth 3 (three) times daily.  Marland Kitchen omeprazole (PRILOSEC) 40 MG capsule Take 1 capsule by mouth once daily  . terbinafine (LAMISIL) 250 MG tablet Take 2 tablets (500 mg total) by mouth daily. Take for 1 week out of every 4 weeks  . vitamin B-12 (CYANOCOBALAMIN) 50 MCG tablet Take 50 mcg by mouth daily.  . Vitamin D, Ergocalciferol, (DRISDOL) 50000 units CAPS capsule Take 1 capsule (50,000 Units total) by mouth every 7 (seven) days.   No facility-administered encounter medications on file as of 05/11/2020.    Current Diagnosis: Patient Active Problem List   Diagnosis Date Noted  . History of kidney stones 02/11/2018  . Epigastric pain 07/24/2016  . Family history of early CAD 11/09/2015  . Contact with or exposure to communicable disease  11/09/2015  . Degeneration of cervical intervertebral disc 11/09/2015  . Insomnia 11/09/2015  . Abnormal results of thyroid function studies 11/09/2015  . Asymptomatic varicose veins 11/09/2015  . Vitamin D deficiency 11/09/2015  . Abnormality of gait 11/09/2015  . History of adenomatous polyp of colon 02/28/2015  . Fibroids 01/04/2015  . Memory difficulty 01/04/2015  . Hearing loss   . Cerebrovascular disease   . Esophageal reflux   . Obstructive sleep apnea   . Urinary incontinence   . Depression   . LBP (low back pain) 12/07/2014  . Benign neoplasm of colon 10/13/2013  . Cervical pain 10/13/2013  . Chronic constipation 10/13/2013  . Benign essential HTN 10/13/2013  . Cephalalgia 10/13/2013  . Hypercholesteremia 10/13/2013  . Abnormal mental state 10/13/2013  . Cerebral artery occlusion with cerebral infarction [facility]) 12/03/2006    Goals Addressed   None     Follow-Up:  [facility] Pharmacy [facility]   Reviewed chart and adherence measures. Per insurance patient is 100%compliant with cholesterol medication.

## 2020-05-18 ENCOUNTER — Telehealth: Payer: Self-pay | Admitting: Family Medicine

## 2020-05-18 NOTE — Telephone Encounter (Signed)
Pt called in stating there is a current medication that she is taking and its making her use the bathroom entirely too much . She doesn't know the name of the medication however is wanting a call back from someone in the office . Please advise

## 2020-05-18 NOTE — Telephone Encounter (Signed)
I don't see Dr. Caryn Section has prescribed this medication previously. This medication typically needs a prior authorization and documentation regarding this issue and treatments failed. I don't see this documented by our clinic. Would recommend follow up in office regarding this issue so we can get this sent in for her.

## 2020-05-18 NOTE — Telephone Encounter (Signed)
I called and spoke with patient. She has a history of chronic constipation. Patient states she used to take a pill to help her have bowel movements. Patient doesn't remember that name of the medication. I think she may be referring to [place]. This medication is listed as a historical medication in her chart. Patient wants this medication refilled. She is going out of town this weekend for an entire month. Patient would like the prescription sent in before she leaves. This is Dr. Maralyn Sago patient. He is out of the office until Monday 05/23/2020. Please advise.   Pharmacy: [address]

## 2020-05-19 NOTE — Telephone Encounter (Signed)
Patient was advised and states she will wait to come see Dr. Caryn Section when he gets back. Just a [place]

## 2020-06-06 DIAGNOSIS — R509 Fever, unspecified: Secondary | ICD-10-CM | POA: Diagnosis not present

## 2020-06-06 DIAGNOSIS — N3001 Acute cystitis with hematuria: Secondary | ICD-10-CM | POA: Diagnosis not present

## 2020-06-06 DIAGNOSIS — Z20822 Contact with and (suspected) exposure to covid-19: Secondary | ICD-10-CM | POA: Diagnosis not present

## 2020-06-08 DIAGNOSIS — W109XXA Fall (on) (from) unspecified stairs and steps, initial encounter: Secondary | ICD-10-CM | POA: Diagnosis not present

## 2020-06-08 DIAGNOSIS — M25511 Pain in right shoulder: Secondary | ICD-10-CM | POA: Diagnosis not present

## 2020-06-08 DIAGNOSIS — S4991XA Unspecified injury of right shoulder and upper arm, initial encounter: Secondary | ICD-10-CM | POA: Diagnosis not present

## 2020-06-08 DIAGNOSIS — N12 Tubulo-interstitial nephritis, not specified as acute or chronic: Secondary | ICD-10-CM | POA: Diagnosis not present

## 2020-06-21 ENCOUNTER — Other Ambulatory Visit: Payer: Self-pay

## 2020-06-21 ENCOUNTER — Ambulatory Visit (INDEPENDENT_AMBULATORY_CARE_PROVIDER_SITE_OTHER): Payer: Medicare HMO | Admitting: Family Medicine

## 2020-06-21 DIAGNOSIS — Z23 Encounter for immunization: Secondary | ICD-10-CM | POA: Diagnosis not present

## 2020-06-21 DIAGNOSIS — E559 Vitamin D deficiency, unspecified: Secondary | ICD-10-CM

## 2020-06-21 MED ORDER — VITAMIN D (ERGOCALCIFEROL) 1.25 MG (50000 UNIT) PO CAPS: 50000.0000 [IU] | ORAL_CAPSULE | ORAL | 4 refills | Status: DC

## 2020-06-21 NOTE — Telephone Encounter (Signed)
Patient came by the office today for flu shot and request refills on Vitamin D supplement and Amitiza. Please review. Amitiza is listed as historical.

## 2020-06-21 NOTE — Progress Notes (Unsigned)
{  Click to show pharmacy dispense history report in sidebar:1}  

## 2020-06-21 NOTE — Telephone Encounter (Signed)
I've never prescribed Amitiza for her. She can make appt to discuss if she likes.

## 2020-06-22 NOTE — Telephone Encounter (Signed)
Patient advised. She says she would call back later to schedule an appointment.

## 2020-06-24 DIAGNOSIS — Z86010 Personal history of colonic polyps: Secondary | ICD-10-CM | POA: Diagnosis not present

## 2020-06-24 DIAGNOSIS — Z8673 Personal history of transient ischemic attack (TIA), and cerebral infarction without residual deficits: Secondary | ICD-10-CM | POA: Diagnosis not present

## 2020-06-24 DIAGNOSIS — K5904 Chronic idiopathic constipation: Secondary | ICD-10-CM | POA: Diagnosis not present

## 2020-06-29 ENCOUNTER — Telehealth: Payer: Self-pay | Admitting: *Deleted

## 2020-06-29 NOTE — Telephone Encounter (Signed)
Copied from [place] [id number]. Topic: General - Inquiry >> Jun 28, 2020  2:37 PM Gillis Ends D wrote: Reason for CRM: Patients daughter called and would like a call back from [facility] in reference to her mother's medication. She can be reached at [phone]. Please advise

## 2020-06-29 NOTE — Telephone Encounter (Signed)
Spoke with Linus Orn, patient's daughter about medication cost concerns for her mother. It seems that the patient has entered the donut hole and the cost of her medications has risen dramatically, but Linus Orn was unsure which ones had increased in price. She also stated that the patient was prescribed a colonoscopy prep kit but it was $47, which the patient did not pick up. She also did not pick up the vitamin B12 Rx due to price.   Recommended the patient pick up vitamin B12 as an OTC rather than prescription medication and to reach out to the GI specialist to see if there was a more affordable alternative she could use for the colonoscopy. Patient's daughter will also call the pharmacy to see which medications have increased in price and report back to me so I can determine next steps in assisting in medication costs.   [place] [phone]

## 2020-07-22 ENCOUNTER — Telehealth: Payer: Self-pay

## 2020-07-22 NOTE — Chronic Care Management (AMB) (Signed)
07/22/2020- Called patient to remind of appointment with Junius Argyle, CPP on 07/25/2020 at 4:00 pm. Patient aware she will receive a telephone call for visit.   Pattricia Boss, [place] Pharmacist Assistant [phone]

## 2020-07-25 ENCOUNTER — Telehealth: Payer: Self-pay

## 2020-07-25 NOTE — Chronic Care Management (AMB) (Unsigned)
Chronic Care Management Pharmacy  Name: Alexis [name]  MRN: [id number] DOB: Dec 01, 1955   Chief Complaint/ HPI  Alexis [name] 80,  65 y.o. , female presents for their Follow-Up CCM visit with the clinical pharmacist In office.  PCP : Alexis Sons, MD  Their chronic conditions include: Chronic Pain, GERD, HLD  HTN, history of CVA, GERD, HLD, depression, insomnia, chronic pain, onychomycosis  Office Visits:NA  Consult Visit:NA  Medications: Outpatient Encounter Medications as of 07/25/2020  Medication Sig  . Acetaminophen (TYLENOL PO) Take by mouth as needed.  Marland Kitchen aspirin EC 81 MG tablet Take 81 mg by mouth daily.  Marland Kitchen atorvastatin (LIPITOR) 20 MG tablet Take 1 tablet (20 mg total) by mouth daily.  Marland Kitchen conjugated estrogens (PREMARIN) vaginal cream Apply vaginally 2-3 times each week.  . gabapentin (NEURONTIN) 800 MG tablet Take 1 tablet by mouth twice daily  . hydrochlorothiazide (HYDRODIURIL) 25 MG tablet Take 1 tablet (25 mg total) by mouth daily.  Marland Kitchen lubiprostone (AMITIZA) 24 MCG capsule Take 24 mcg by mouth daily with breakfast.   . methocarbamol (ROBAXIN) 750 MG tablet Take 1 tablet (750 mg total) by mouth 3 (three) times daily.  Marland Kitchen omeprazole (PRILOSEC) 40 MG capsule Take 1 capsule by mouth once daily  . terbinafine (LAMISIL) 250 MG tablet Take 2 tablets (500 mg total) by mouth daily. Take for 1 week out of every 4 weeks  . vitamin B-12 (CYANOCOBALAMIN) 50 MCG tablet Take 50 mcg by mouth daily.  . Vitamin D, Ergocalciferol, (DRISDOL) 1.25 MG (50000 UNIT) CAPS capsule Take 1 capsule (50,000 Units total) by mouth every 7 (seven) days.   No facility-administered encounter medications on file as of 07/25/2020.   Financial Resource Strain: Low Risk   . Difficulty of Paying Living Expenses: Not hard at all     Current Diagnosis/Assessment:  Goals Addressed   None    Hypertension   BP goal is:  <140/90  Office blood pressures are  BP Readings from Last 3 Encounters:   04/20/20 116/75  04/12/20 130/74  03/04/20 (!) 142/79   Patient checks BP at home infrequently Patient home BP readings are ranging: NA  Patient has failed these meds in the past: NA Patient is currently controlled on the following medications:  . Hydrochlorothiazide 49m daily  We discussed:   Plan  Continue current medications   Hyperlipidemia   LDL goal < ***  Last lipids Lab Results  Component Value Date   CHOL 154 04/25/2020   HDL 54 04/25/2020   LDLCALC 83 04/25/2020   TRIG 88 04/25/2020   CHOLHDL 2.9 04/25/2020   Hepatic Function Latest Ref Rng & Units 04/25/2020 02/11/2019 12/26/2017  Total Protein 6.0 - 8.5 g/dL 7.3 7.1 7.4  Albumin 3.8 - 4.8 g/dL 4.4 4.4 4.5  AST 0 - 40 IU/L _0 ALT 0 - 32 IU/L _1 Alk Phosphatase 44 - 121 IU/L 58 62 61  Total Bilirubin 0.0 - 1.2 mg/dL 0.4 0.6 0.6     The ASCVD Risk score (Mikey BussingDC Jr., et al., 2013) failed to calculate for the following reasons:   The patient has a prior MI or stroke diagnosis   Patient has failed these meds in past: *** Patient is currently {CHL Controlled/Uncontrolled:[phone]} on the following medications:  . Aspirin 81 mg daily  . Atorvastatin 20 mg daily   We discussed:  {CHL [facility] discussion:[id number]}  Plan  Continue {CHL [facility] Pharmacy Plans:[phone]}  GERD  Patient {Actions; denies-reports:120008::"denies"} {gerd assoc sx:31969:o:"dysphagia","heartburn","nausea"}. Expresses understanding to avoid triggers such as {causes; exacerbators GERD:13199}.  Currently {CHL Controlled/[phone]} on: . Omeprazole 40 mg daily   Plan   {rxplan:23810::"Continue current medication."}   Cervical Pain   Patient has failed these meds in past: NA Patient is currently uncontrolled on the following medications:  . Acetaminophen  . Gabapentin 847m twice daily . Methocarbamol 750 mg three times daily   We discussed:    Plan  Continue current  medications  Misc / OTC   . Premarin vaginal cream 2-3 times weekly  . Lubiprostone 25 mcg daily  . Terbinafine 250 mg 2 tablets daily for 1 week out of 4 weeks . Vitamin B12 50 mcg daily  . Ergocalciferol 50,000 units weekly   We discussed:  ***  Plan  Continue {CHL [facility] PBCWUG:[id number]}  Medication Management   Pt uses [facility] pharmacy for all medications Uses pill box? Yes Pt endorses 100% compliance  We discussed:   Goes to gym 4 - 5 times weekly Treadmill, small dumbells, has friends  Plan  Continue current medication management strategy  Follow up: 3 month phone visit  ABayou [phone]

## 2020-08-05 ENCOUNTER — Telehealth: Payer: Self-pay

## 2020-08-29 ENCOUNTER — Telehealth: Payer: Self-pay

## 2020-08-29 NOTE — Progress Notes (Signed)
Spoke to patient  to confirmed patient telephone appointment on 08/30/2020 for CCM at 3:30 pm with Junius Argyle the Clinical pharmacist.   Patient denies side effect from  Her current medication.  Patient  Verbalized understanding.  [place] Pharmacist Assistant [phone]

## 2020-08-30 ENCOUNTER — Ambulatory Visit (INDEPENDENT_AMBULATORY_CARE_PROVIDER_SITE_OTHER): Payer: Medicare HMO

## 2020-08-30 DIAGNOSIS — E7800 Pure hypercholesterolemia, unspecified: Secondary | ICD-10-CM

## 2020-08-30 DIAGNOSIS — I10 Essential (primary) hypertension: Secondary | ICD-10-CM

## 2020-08-30 NOTE — Progress Notes (Signed)
Chronic Care Management Pharmacy Note  09/05/2020 Name:  Alexis [name] MRN:  [id number] DOB:  [birth year]  Subjective: Alexis [name] is an 65 y.o. year old female who is a primary patient of Fisher, Kirstie Peri, MD.  The CCM team was consulted for assistance with disease management and care coordination needs.    Engaged with patient by telephone for follow up visit in response to provider referral for pharmacy case management and/or care coordination services.   Consent to Services:  The patient was given the following information about Chronic Care Management services today, agreed to services, and gave verbal consent: 1. CCM service includes personalized support from designated clinical staff supervised by the primary care provider, including individualized plan of care and coordination with other care providers 2. 24/7 contact phone numbers for assistance for urgent and routine care needs. 3. Service will only be billed when office clinical staff spend 20 minutes or more in a month to coordinate care. 4. Only one practitioner may furnish and bill the service in a calendar month. 5.The patient may stop CCM services at any time (effective at the end of the month) by phone call to the office staff. 6. The patient will be responsible for cost sharing (co-pay) of up to 20% of the service fee (after annual deductible is met). Patient agreed to services and consent obtained.  Patient Care Team: Birdie Sons, MD as PCP - General (Family Medicine) Lorelee Cover., MD (Ophthalmology) [name], MD as Referring Physician (Obstetrics and Gynecology) Germaine Pomfret, [facility] as Pharmacist (Pharmacist)  Recent office visits: 04/30/20: Patient presented to Dr. Caryn Section for follow-up.   Recent consult visits: None in previous 6 months  Hospital visits: None in previous 6 months  Objective:  Lab Results  Component Value Date   CREATININE 1.04 (H) 04/25/2020   BUN 19 04/25/2020   GFRNONAA  56 (L) 04/25/2020   GFRAA 65 04/25/2020   NA 139 04/25/2020   K 4.0 04/25/2020   CALCIUM 10.1 04/25/2020   CO2 26 04/25/2020    Lab Results  Component Value Date/Time   HGBA1C 6.1 (H) 02/11/2019 10:58 AM   HGBA1C 5.9 (H) 12/26/2017 10:56 AM    Last diabetic Eye exam: No results found for: HMDIABEYEEXA  Last diabetic Foot exam: No results found for: HMDIABFOOTEX   Lab Results  Component Value Date   CHOL 154 04/25/2020   HDL 54 04/25/2020   LDLCALC 83 04/25/2020   TRIG 88 04/25/2020   CHOLHDL 2.9 04/25/2020    Hepatic Function Latest Ref Rng & Units 04/25/2020 02/11/2019 12/26/2017  Total Protein 6.0 - 8.5 g/dL 7.3 7.1 7.4  Albumin 3.8 - 4.8 g/dL 4.4 4.4 4.5  AST 0 - 40 IU/L $Remov'27 24 24  'NYrkey$ ALT 0 - 32 IU/L $Remov'28 18 23  'qMQAND$ Alk Phosphatase 44 - 121 IU/L 58 62 61  Total Bilirubin 0.0 - 1.2 mg/dL 0.4 0.6 0.6    Lab Results  Component Value Date/Time   TSH 0.962 02/11/2019 10:58 AM   TSH 0.968 05/23/2016 04:03 PM    CBC Latest Ref Rng & Units 04/25/2020 07/24/2016 05/23/2016  WBC 3.4 - 10.8 x10E3/uL 7.5 5.0 6.3  Hemoglobin 11.1 - 15.9 g/dL 13.2 13.2 12.4  Hematocrit 34.0 - 46.6 % 38.7 40.3 37.0  Platelets 150 - 450 x10E3/uL 326 297 250    Lab Results  Component Value Date/Time   VD25OH 34.3 04/25/2020 11:03 AM   VD25OH 51.1 02/11/2019 10:58 AM    Clinical  ASCVD: Yes  The ASCVD Risk score Mikey Bussing DC Jr., et al., 2013) failed to calculate for the following reasons:   The patient has a prior MI or stroke diagnosis    Depression screen [facility] 2/9 04/20/2020 04/12/2020 09/28/2019  Decreased Interest 2 3 0  Down, Depressed, Hopeless 1 1 1   PHQ - 2 Score 3 4 1   Altered sleeping 2 2 -  Tired, decreased energy 2 2 -  Change in appetite 2 3 -  Feeling bad or failure about yourself  1 1 -  Trouble concentrating 2 2 -  Moving slowly or fidgety/restless 2 2 -  Suicidal thoughts 0 0 -  PHQ-9 Score 14 16 -  Difficult doing work/chores Somewhat difficult Somewhat difficult -     Social  History   Tobacco Use  Smoking Status Never Smoker  Smokeless Tobacco Never Used   BP Readings from Last 3 Encounters:  04/20/20 116/75  04/12/20 130/74  03/04/20 (!) 142/79   Pulse Readings from Last 3 Encounters:  04/20/20 60  04/12/20 77  03/04/20 63   Wt Readings from Last 3 Encounters:  04/20/20 194 lb (88 kg)  04/12/20 191 lb 3.2 oz (86.7 kg)  03/04/20 193 lb (87.5 kg)    Assessment/Interventions: Review of patient past medical history, allergies, medications, health status, including review of consultants reports, laboratory and other test data, was performed as part of comprehensive evaluation and provision of chronic care management services.   SDOH:  (Social Determinants of Health) assessments and interventions performed: Yes SDOH Interventions   Flowsheet Row Most Recent Value  SDOH Interventions   Financial Strain Interventions Intervention Not Indicated  Transportation Interventions Intervention Not Indicated      CCM Care Plan  No Known Allergies  Medications Reviewed Today    Reviewed by Germaine Pomfret, [place] (Pharmacist) on 09/05/20 at 0857  Med List Status: <None>  Medication Order Taking? Sig Documenting Provider Last Dose Status Informant  Acetaminophen (TYLENOL PO) [id number] No Take by mouth as needed. [provider] Taking Active   aspirin EC 81 MG tablet [id number] No Take 81 mg by mouth daily. [provider] Taking Active   atorvastatin (LIPITOR) 20 MG tablet [id number] No Take 1 tablet (20 mg total) by mouth daily. Birdie Sons, MD Taking Active   conjugated estrogens (PREMARIN) vaginal cream [id number]  Apply vaginally 2-3 times each week. Birdie Sons, MD  Active   gabapentin (NEURONTIN) 800 MG tablet [id number]  Take 1 tablet by mouth twice daily Fisher, Kirstie Peri, MD  Active   hydrochlorothiazide (HYDRODIURIL) 25 MG tablet [id number] No Take 1 tablet (25 mg total) by mouth daily. Birdie Sons, MD Taking Active    lubiprostone [facility]) 24 MCG capsule [id number] No Take 24 mcg by mouth daily with breakfast.  [provider] Taking Active   methocarbamol (ROBAXIN) 750 MG tablet [id number] No Take 1 tablet (750 mg total) by mouth 3 (three) times daily. Birdie Sons, MD Taking Active   omeprazole (PRILOSEC) 40 MG capsule [id number] No Take 1 capsule by mouth once daily Fisher, Kirstie Peri, MD Taking Active   terbinafine (LAMISIL) 250 MG tablet [id number] No Take 2 tablets (500 mg total) by mouth daily. Take for 1 week out of every 4 weeks Fisher, Kirstie Peri, MD Taking Active   vitamin B-12 (CYANOCOBALAMIN) 50 MCG tablet [id number] No Take 50 mcg by mouth daily. [provider] Taking Active   Vitamin D, Ergocalciferol, (DRISDOL) 1.25 MG (50000 UNIT) CAPS  capsule [id number]  Take 1 capsule (50,000 Units total) by mouth every 7 (seven) days. Birdie Sons, MD  Active           Patient Active Problem List   Diagnosis Date Noted  . History of kidney stones 02/11/2018  . Epigastric pain 07/24/2016  . Family history of early CAD 11/09/2015  . Contact with or exposure to communicable disease 11/09/2015  . Degeneration of cervical intervertebral disc 11/09/2015  . Insomnia 11/09/2015  . Abnormal results of thyroid function studies 11/09/2015  . Asymptomatic varicose veins 11/09/2015  . Vitamin D deficiency 11/09/2015  . Abnormality of gait 11/09/2015  . History of adenomatous polyp of colon 02/28/2015  . Fibroids 01/04/2015  . Memory difficulty 01/04/2015  . Hearing loss   . Cerebrovascular disease   . Esophageal reflux   . Obstructive sleep apnea   . Urinary incontinence   . Depression   . LBP (low back pain) 12/07/2014  . Benign neoplasm of colon 10/13/2013  . Cervical pain 10/13/2013  . Chronic constipation 10/13/2013  . Benign essential HTN 10/13/2013  . Cephalalgia 10/13/2013  . Hypercholesteremia 10/13/2013  . Abnormal mental state 10/13/2013  . Cerebral artery occlusion with  cerebral infarction ([place]) 12/03/2006    Immunization History  Administered Date(s) Administered  . Fluad Quad(high Dose 65+) 06/21/2020  . Influenza,inj,Quad PF,6+ Mos 05/23/2016, 05/22/2017  . PFIZER(Purple Top)SARS-COV-2 Vaccination 08/29/2019, 09/19/2019    Conditions to be addressed/monitored:  Hypertension, Hyperlipidemia, Depression and Insomnia, Chronic Pain, Constipation  Care Plan : General Pharmacy (Adult)  Updates made by Germaine Pomfret, RPH since 09/05/2020 12:00 AM    Problem: Hypertension, Hyperlipidemia, Depression and Insomnia, Chronic Pain, Constipation   Priority: High    Goal: Patient-Specific Goal   Start Date: 09/05/2020  Expected End Date: 03/05/2021  This Visit's Progress: On track  Priority: High  Note:   Current Barriers:  . No barriers noted  Pharmacist Clinical Goal(s):  Marland Kitchen Over the next 90 days, patient will maintain control of blood pressure  as evidenced by BP less 140/90  through collaboration with PharmD and provider.   Interventions: . 1:1 collaboration with Birdie Sons, MD regarding development and update of comprehensive plan of care as evidenced by provider attestation and co-signature . Inter-disciplinary care team collaboration (see longitudinal plan of care) . Comprehensive medication review performed; medication list updated in electronic medical record  Hypertension (BP goal <140/90) -controlled -Current treatment: . HCTZ 25 mg daily  -Medications previously tried: NA  -Current home readings: NA -Current dietary habits: Does not follow any specific dietary regimen -Current exercise habits: Does not follow any specific exercise regimen  -Denies hypotensive/hypertensive symptoms -Educated on Daily salt intake goal < 2300 mg; -Counseled to monitor BP at home weekly, document, and provide log at future appointments -Recommended to continue current medication  Hyperlipidemia: (LDL goal < 100) -controlled -Current  treatment: . Atorvastatin 20 mg daily  -Medications previously tried: NA  -Educated on Importance of limiting foods high in cholesterol; -Recommended to continue current medication  Patient Goals/Self-Care Activities . Over the next 90 days, patient will:  - check blood pressure weekly, document, and provide at future appointments  Follow Up Plan: Telephone follow up appointment with care management team member scheduled for: 02/27/2021 at 3:30 PM       Medication Assistance: None required.  Patient affirms current coverage meets needs.  Patient's preferred pharmacy is:  [facility] [address] [place]  [place] [address] Phone: [phone] Fax: [phone]  [id number] - [place], [place] [address] [place] [address] Phone: [phone] Fax: [phone]  [id number] - [place], [place] [address] [place] [address] Phone: [phone] Fax: [phone]  Uses pill box? Yes Pt endorses 100% compliance  We discussed: Current pharmacy is preferred with insurance plan and patient is satisfied with pharmacy services Patient decided to: Continue current medication management strategy  Care Plan and Follow Up Patient Decision:  Patient agrees to Care Plan and Follow-up.  Plan: Telephone follow up appointment with care management team member scheduled for:  02/27/21 at 3:30 PM   [place] [phone]

## 2020-09-05 NOTE — Patient Instructions (Signed)
Visit Information It was great speaking with you today!  Please let me know if you have any questions about our visit.  Goals Addressed            This Visit's Progress   . Track and Manage My Blood Pressure-Hypertension       Timeframe:  Long-Range Goal Priority:  High Start Date:  09/05/2020                           Expected End Date: 03/05/2021                      Follow Up Date 11/12/2020    - check blood pressure weekly    Why is this important?    You won't feel high blood pressure, but it can still hurt your blood vessels.   High blood pressure can cause heart or kidney problems. It can also cause a stroke.   Making lifestyle changes like losing a little weight or eating less salt will help.   Checking your blood pressure at home and at different times of the day can help to control blood pressure.   If the doctor prescribes medicine remember to take it the way the doctor ordered.   Call the office if you cannot afford the medicine or if there are questions about it.     Notes:        Patient Care Plan: General Pharmacy (Adult)    Problem Identified: Hypertension, Hyperlipidemia, Depression and Insomnia, Chronic Pain, Constipation   Priority: High    Goal: Patient-Specific Goal   Start Date: 09/05/2020  Expected End Date: 03/05/2021  This Visit's Progress: On track  Priority: High  Note:   Current Barriers:  . No barriers noted  Pharmacist Clinical Goal(s):  Marland Kitchen Over the next 90 days, patient will maintain control of blood pressure  as evidenced by BP less 140/90  through collaboration with PharmD and provider.   Interventions: . 1:1 collaboration with Birdie Sons, MD regarding development and update of comprehensive plan of care as evidenced by provider attestation and co-signature . Inter-disciplinary care team collaboration (see longitudinal plan of care) . Comprehensive medication review performed; medication list updated in electronic medical  record  Hypertension (BP goal <140/90) -controlled -Current treatment: . HCTZ 25 mg daily  -Medications previously tried: NA  -Current home readings: NA -Current dietary habits: Does not follow any specific dietary regimen -Current exercise habits: Does not follow any specific exercise regimen  -Denies hypotensive/hypertensive symptoms -Educated on Daily salt intake goal < 2300 mg; -Counseled to monitor BP at home weekly, document, and provide log at future appointments -Recommended to continue current medication  Hyperlipidemia: (LDL goal < 100) -controlled -Current treatment: . Atorvastatin 20 mg daily  -Medications previously tried: NA  -Educated on Importance of limiting foods high in cholesterol; -Recommended to continue current medication  Patient Goals/Self-Care Activities . Over the next 90 days, patient will:  - check blood pressure weekly, document, and provide at future appointments  Follow Up Plan: Telephone follow up appointment with care management team member scheduled for: 02/27/2021 at 3:30 PM       Patient agreed to services and verbal consent obtained.   The patient verbalized understanding of instructions, educational materials, and care plan provided today and declined offer to receive copy of patient instructions, educational materials, and care plan.   [place] [phone]
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
## 2020-10-03 ENCOUNTER — Ambulatory Visit: Payer: Self-pay | Admitting: *Deleted

## 2020-10-03 ENCOUNTER — Emergency Department: Payer: Medicare HMO

## 2020-10-03 ENCOUNTER — Emergency Department
Admission: EM | Admit: 2020-10-03 | Discharge: 2020-10-03 | Disposition: A | Discharge status: Home / Self Care | Payer: Medicare HMO | Attending: Student in an Organized Health Care Education/Training Program | Admitting: Student in an Organized Health Care Education/Training Program

## 2020-10-03 ENCOUNTER — Other Ambulatory Visit: Payer: Self-pay

## 2020-10-03 DIAGNOSIS — Z79899 Other long term (current) drug therapy: Secondary | ICD-10-CM | POA: Diagnosis not present

## 2020-10-03 DIAGNOSIS — Z85038 Personal history of other malignant neoplasm of large intestine: Secondary | ICD-10-CM | POA: Diagnosis not present

## 2020-10-03 DIAGNOSIS — R531 Weakness: Secondary | ICD-10-CM | POA: Diagnosis not present

## 2020-10-03 DIAGNOSIS — G44209 Tension-type headache, unspecified, not intractable: Secondary | ICD-10-CM | POA: Diagnosis not present

## 2020-10-03 DIAGNOSIS — H9201 Otalgia, right ear: Secondary | ICD-10-CM | POA: Insufficient documentation

## 2020-10-03 DIAGNOSIS — R4781 Slurred speech: Secondary | ICD-10-CM | POA: Diagnosis not present

## 2020-10-03 DIAGNOSIS — Z7982 Long term (current) use of aspirin: Secondary | ICD-10-CM | POA: Diagnosis not present

## 2020-10-03 DIAGNOSIS — R519 Headache, unspecified: Secondary | ICD-10-CM | POA: Diagnosis not present

## 2020-10-03 DIAGNOSIS — I10 Essential (primary) hypertension: Secondary | ICD-10-CM | POA: Diagnosis not present

## 2020-10-03 HISTORY — DX: Essential (primary) hypertension: I10

## 2020-10-03 LAB — CBG MONITORING, ED: Glucose-Capillary: 80 mg/dL (ref 70–99)

## 2020-10-03 MED ORDER — HYDROCODONE-ACETAMINOPHEN 5-325 MG PO TABS
1.0000 | ORAL_TABLET | Freq: Once | ORAL | Status: AC
  Administered 2020-10-03: 1 via ORAL
  Filled 2020-10-03: qty 1

## 2020-10-03 MED ORDER — BACLOFEN 10 MG PO TABS: 10.0000 mg | ORAL_TABLET | Freq: Three times a day (TID) | ORAL | 0 refills | Status: AC

## 2020-10-03 MED ORDER — TRAMADOL HCL 50 MG PO TABS: 50.0000 mg | ORAL_TABLET | Freq: Four times a day (QID) | ORAL | 0 refills | Status: DC | PRN

## 2020-10-03 NOTE — ED Triage Notes (Signed)
Pt c/o right ear pain with right sided headache since friday

## 2020-10-03 NOTE — ED Notes (Signed)
See triage note  Presents with right sided ear pain and h/a   Sx's started on Friday  Denies any fever or n/v  Afebrile on arrival

## 2020-10-03 NOTE — Telephone Encounter (Signed)
Pt's daughter Alexis [name] calling, stated pt with "Severe headache for 3 days." Pt not with daughter, Alexis [name] conferenced her in.  Pt reports 10/10 headache, onset Friday.Reports pain right side of head only, including ear. States has been taking tylenol, ineffective. States "Worse headache ever, worse than migraines."   Pt directed to ED, Alexis [name] will arrange driver. States will follow disposition.   Reason for Disposition . [1] SEVERE headache (e.g., excruciating) AND [2] "worst headache" of life  Answer Assessment - Initial Assessment Questions 1. LOCATION: "Where does it hurt?"      Right ear, headache right side 2. ONSET: "When did the headache start?" (Minutes, hours or days)      friday 3. PATTERN: "Does the pain come and go, or has it been constant since it started?"     Constant 4. SEVERITY: "How bad is the pain?" and "What does it keep you from doing?"  (e.g., Scale 1-10; mild, moderate, or severe)   - MILD (1-3): doesn't interfere with normal activities    - MODERATE (4-7): interferes with normal activities or awakens from sleep    - SEVERE (8-10): excruciating pain, unable to do any normal activities       10/10 5. RECURRENT SYMPTOM: "Have you ever had headaches before?" If Yes, ask: "When was the last time?" and "What happened that time?"     Yes 6. CAUSE: "What do you think is causing the headache?"     Unsure 7. MIGRAINE: "Have you been diagnosed with migraine headaches?" If Yes, ask: "Is this headache similar?"      Worse than migraine 8. HEAD INJURY: "Has there been any recent injury to the head?"  no    9. OTHER SYMPTOMS: "Do you have any other symptoms?" (fever, stiff neck, eye pain, sore throat, cold symptoms)    no  Protocols used: HEADACHE-A-AH

## 2020-10-03 NOTE — ED Provider Notes (Signed)
[facility] Emergency Department Provider Note  ____________________________________________   Event Date/Time   First MD Initiated Contact with Patient 10/03/20 1030     (approximate)  I have reviewed the triage vital signs and the nursing notes.   HISTORY  Chief Complaint Otalgia    HPI Alexis [name] is a 65 y.o. female presents emergency department with a headache and right ear pain.  Patient states she has had some slurred speech on and off.  History of migraines but this is different.  Has trouble with her blood pressure.  No vomiting.  No fever or chills.  Patient states she is just very tired and weak    Past Medical History:  Diagnosis Date  . Depression   . History of chicken pox   . Hyperlipidemia   . Hypertension   . Hypertrophy of uterus    + uterine fibroids by pelvic ultrasound 2012  . Lacunar infarction ([place])   . Other tenosynovitis of hand and wrist     Patient Active Problem List   Diagnosis Date Noted  . History of kidney stones 02/11/2018  . Epigastric pain 07/24/2016  . Family history of early CAD 11/09/2015  . Contact with or exposure to communicable disease 11/09/2015  . Degeneration of cervical intervertebral disc 11/09/2015  . Insomnia 11/09/2015  . Abnormal results of thyroid function studies 11/09/2015  . Asymptomatic varicose veins 11/09/2015  . Vitamin D deficiency 11/09/2015  . Abnormality of gait 11/09/2015  . History of adenomatous polyp of colon 02/28/2015  . Fibroids 01/04/2015  . Memory difficulty 01/04/2015  . Hearing loss   . Cerebrovascular disease   . Esophageal reflux   . Obstructive sleep apnea   . Urinary incontinence   . Depression   . LBP (low back pain) 12/07/2014  . Benign neoplasm of colon 10/13/2013  . Cervical pain 10/13/2013  . Chronic constipation 10/13/2013  . Benign essential HTN 10/13/2013  . Cephalalgia 10/13/2013  . Hypercholesteremia 10/13/2013  . Abnormal mental state  10/13/2013  . Cerebral artery occlusion with cerebral infarction ([place]) 12/03/2006    Past Surgical History:  Procedure Laterality Date  . BACK SURGERY     disc pressing against a nerve x 2  . CERVICAL SPINE SURGERY     x 3   [place]  . COLONOSCOPY    . COLONOSCOPY WITH PROPOFOL N/A 02/14/2015   Procedure: COLONOSCOPY WITH PROPOFOL;  Surgeon: Hulen Luster, MD;  Location: [facility] ENDOSCOPY;  Service: Gastroenterology;  Laterality: N/A;  . POLYPECTOMY    . RLE Varicosities  laser surgery    09/2010  . varicose vein ligation and stripping     Right    Prior to Admission medications   Medication Sig Start Date End Date Taking? Authorizing Provider  baclofen (LIORESAL) 10 MG tablet Take 1 tablet (10 mg total) by mouth 3 (three) times daily for 7 days. 10/03/20 10/10/20 Yes Clarissia Mckeen, Linden Dolin, PA-C  traMADol (ULTRAM) 50 MG tablet Take 1 tablet (50 mg total) by mouth every 6 (six) hours as needed. 10/03/20  Yes Jabaree Mercado, Linden Dolin, PA-C  Acetaminophen (TYLENOL PO) Take by mouth as needed.    [provider]  aspirin EC 81 MG tablet Take 81 mg by mouth daily.    [provider]  atorvastatin (LIPITOR) 20 MG tablet Take 1 tablet (20 mg total) by mouth daily. 05/27/19   Birdie Sons, MD  conjugated estrogens (PREMARIN) vaginal cream Apply vaginally 2-3 times each week. 04/20/20  Birdie Sons, MD  gabapentin (NEURONTIN) 800 MG tablet Take 1 tablet by mouth twice daily 05/08/20   Birdie Sons, MD  hydrochlorothiazide (HYDRODIURIL) 25 MG tablet Take 1 tablet (25 mg total) by mouth daily. 11/29/19   Birdie Sons, MD  lubiprostone (AMITIZA) 24 MCG capsule Take 24 mcg by mouth daily with breakfast.     [provider]  omeprazole (PRILOSEC) 40 MG capsule Take 1 capsule by mouth once daily 09/24/19   Birdie Sons, MD  terbinafine (LAMISIL) 250 MG tablet Take 2 tablets (500 mg total) by mouth daily. Take for 1 week out of every 4 weeks 09/30/19   Birdie Sons, MD  vitamin  B-12 (CYANOCOBALAMIN) 50 MCG tablet Take 50 mcg by mouth daily.    [provider]  Vitamin D, Ergocalciferol, (DRISDOL) 1.25 MG (50000 UNIT) CAPS capsule Take 1 capsule (50,000 Units total) by mouth every 7 (seven) days. 06/21/20   Birdie Sons, MD    Allergies Patient has no known allergies.  Family History  Problem Relation Age of Onset  . Diabetes Mother   . Heart disease Mother   . Hypertension Mother   . Aneurysm Mother        stomach and the brain  . Heart disease Sister        had open heart sx  . Diabetes Sister   . Hypertension Sister   . Migraines Sister     Social History Social History   Tobacco Use  . Smoking status: Never Smoker  . Smokeless tobacco: Never Used  Vaping Use  . Vaping Use: Never used  Substance Use Topics  . Alcohol use: No  . Drug use: No    Review of Systems  Constitutional: No fever/chills Eyes: No visual changes. ENT: No sore throat. Respiratory: Denies cough Cardiovascular: Denies chest pain Gastrointestinal: Denies abdominal pain Genitourinary: Negative for dysuria. Musculoskeletal: Negative for back pain. Skin: Negative for rash. Psychiatric: no mood changes,     ____________________________________________   PHYSICAL EXAM:  VITAL SIGNS: ED Triage Vitals  Enc Vitals Group     BP 10/03/20 1017 (!) 141/85     Pulse Rate 10/03/20 1017 (!) 57     Resp 10/03/20 1017 15     Temp 10/03/20 1017 97.9 F (36.6 C)     Temp Source 10/03/20 1017 Oral     SpO2 10/03/20 1017 100 %     Weight 10/03/20 1017 186 lb (84.4 kg)     Height 10/03/20 1017 5\' 5"  (1.651 m)     Head Circumference --      Peak Flow --      Pain Score 10/03/20 1017 9     Pain Loc --      Pain Edu? --      Excl. in [place]? --     Constitutional: Alert and oriented. Well appearing and in no acute distress. Eyes: Conjunctivae are normal.  PERRL Head: Atraumatic. Ears:  tms clear b/l Nose: No congestion/rhinnorhea. Mouth/Throat: Mucous  membranes are moist.   Neck:  supple no lymphadenopathy noted Cardiovascular: Normal rate, regular rhythm. Heart sounds are normal Respiratory: Normal respiratory effort.  No retractions, lungs c t a  GU: deferred Musculoskeletal: FROM all extremities, warm and well perfused Neurologic:  Normal speech and language.  Patient is able to smile and frown without difficulty, grips are equal bilaterally, cranial nerves II through XII grossly intact Skin:  Skin is warm, dry and intact. No rash noted.  Psychiatric: Mood and affect are normal. Speech and behavior are normal.  ____________________________________________   LABS (all labs ordered are listed, but only abnormal results are displayed)  Labs Reviewed  CBG MONITORING, ED   ____________________________________________   ____________________________________________  RADIOLOGY  CT of the head  ____________________________________________   PROCEDURES  Procedure(s) performed: No  Procedures    ____________________________________________   INITIAL IMPRESSION / ASSESSMENT AND PLAN / ED COURSE  Pertinent labs & imaging results that were available during my care of the patient were reviewed by me and considered in my medical decision making (see chart for details).   Patient 65 year old female presents with a headache.  See HPI.  Physical exam shows patient appears stable at this time  cbg is 80 Ct head is negative  Pt does not have slurred speech or any neurolgical findings prior to discharge She was given vicodin for pain  rx for baclofen and tramadol, return if worsening, f/u with regular doctor in 2 days if not improving, discharged in stable condition     Alexis [name] was evaluated in Emergency Department on 10/03/2020 for the symptoms described in the history of present illness. She was evaluated in the context of the global COVID-19 pandemic, which necessitated consideration that the patient might be at risk  for infection with the SARS-CoV-2 virus that causes COVID-19. Institutional protocols and algorithms that pertain to the evaluation of patients at risk for COVID-19 are in a state of rapid change based on information released by regulatory bodies including the CDC and federal and state organizations. These policies and algorithms were followed during the patient's care in the ED.    As part of my medical decision making, I reviewed the following data within the [place] notes reviewed and incorporated, Labs reviewed , Old chart reviewed, Radiograph reviewed , Notes from prior ED visits and [place] Controlled Substance Database  ____________________________________________   FINAL CLINICAL IMPRESSION(S) / ED DIAGNOSES  Final diagnoses:  Tension headache      NEW MEDICATIONS STARTED DURING THIS VISIT:  Discharge Medication List as of 10/03/2020 12:23 PM    START taking these medications   Details  baclofen (LIORESAL) 10 MG tablet Take 1 tablet (10 mg total) by mouth 3 (three) times daily for 7 days., Starting Mon 10/03/2020, Until Mon 10/10/2020, Normal    traMADol (ULTRAM) 50 MG tablet Take 1 tablet (50 mg total) by mouth every 6 (six) hours as needed., Starting Mon 10/03/2020, Normal         Note:  This document was prepared using Dragon voice recognition software and may include unintentional dictation errors.    Versie Starks, PA-C 10/03/20 1549    Merlyn Lot, MD 10/14/20 1537

## 2020-10-03 NOTE — ED Notes (Signed)
Pt alert and oriented X 4, stable for discharge. RR even and unlabored, color WNL. Discussed discharge instructions and follow-up as directed. Discharge medications discussed if prescribed. Pt had opportunity to ask questions, and RN to provide patient/family eduction.  

## 2020-10-03 NOTE — Discharge Instructions (Addendum)
Follow-up with your regular doctor if not improving to 3 days.  Return emergency department worsening.  Take medication as prescribed. 

## 2020-10-11 ENCOUNTER — Ambulatory Visit: Payer: Medicare HMO | Admitting: Family Medicine

## 2020-10-11 NOTE — Progress Notes (Signed)
This encounter was created in error - please disregard.

## 2020-10-12 ENCOUNTER — Other Ambulatory Visit: Payer: Self-pay
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
## 2020-10-18 ENCOUNTER — Other Ambulatory Visit: Payer: Self-pay

## 2020-10-18 ENCOUNTER — Ambulatory Visit (INDEPENDENT_AMBULATORY_CARE_PROVIDER_SITE_OTHER): Payer: Medicare HMO

## 2020-10-18 ENCOUNTER — Ambulatory Visit (INDEPENDENT_AMBULATORY_CARE_PROVIDER_SITE_OTHER): Payer: Medicare HMO | Admitting: Family Medicine

## 2020-10-18 VITALS — BP 117/67 | HR 72 | Ht 65.5 in | Wt 187.4 lb

## 2020-10-18 DIAGNOSIS — Z1211 Encounter for screening for malignant neoplasm of colon: Secondary | ICD-10-CM | POA: Diagnosis not present

## 2020-10-18 DIAGNOSIS — R2689 Other abnormalities of gait and mobility: Secondary | ICD-10-CM | POA: Diagnosis not present

## 2020-10-18 DIAGNOSIS — G8929 Other chronic pain: Secondary | ICD-10-CM | POA: Diagnosis not present

## 2020-10-18 DIAGNOSIS — Z86010 Personal history of colonic polyps: Secondary | ICD-10-CM | POA: Diagnosis not present

## 2020-10-18 DIAGNOSIS — R42 Dizziness and giddiness: Secondary | ICD-10-CM | POA: Diagnosis not present

## 2020-10-18 DIAGNOSIS — H9193 Unspecified hearing loss, bilateral: Secondary | ICD-10-CM

## 2020-10-18 DIAGNOSIS — H9201 Otalgia, right ear: Secondary | ICD-10-CM | POA: Diagnosis not present

## 2020-10-18 DIAGNOSIS — Z0000 Encounter for general adult medical examination without abnormal findings: Secondary | ICD-10-CM

## 2020-10-18 DIAGNOSIS — R519 Headache, unspecified: Secondary | ICD-10-CM

## 2020-10-18 DIAGNOSIS — R270 Ataxia, unspecified: Secondary | ICD-10-CM | POA: Diagnosis not present

## 2020-10-18 NOTE — Patient Instructions (Signed)
[name] , Thank you for taking time to come for your Medicare Wellness Visit. I appreciate your ongoing commitment to your health goals. Please review the following plan we discussed and let me know if I can assist you in the future.   Screening recommendations/referrals: Colonoscopy: Currently due, referral to GI placed today. Pt aware office will contact her to schedule apt.  Mammogram: Up to date, due 04/2021 Bone Density: Up to date, due 02/2024 Recommended yearly ophthalmology/optometry visit for glaucoma screening and checkup Recommended yearly dental visit for hygiene and checkup  Vaccinations: Influenza vaccine: Done 06/21/20 Pneumococcal vaccine: Currently due, declined receiving.  Tdap vaccine: Currently due, declined receiving.  Shingles vaccine: Shingrix discussed. Please contact your pharmacy for coverage information.     Advanced directives: Currently on file.  Conditions/risks identified: Recommend eating 3 small meals a day with 2 healthy protein snacks in between to help aid in weight loss.   Next appointment: today @ 11:20 AM with Dr Caryn Section. Declined scheduling an AWV for 2023 at this time.    Preventive Care 50 Years and Older, Female Preventive care refers to lifestyle choices and visits with your health care provider that can promote health and wellness. What does preventive care include?  A yearly physical exam. This is also called an annual well check.  Dental exams once or twice a year.  Routine eye exams. Ask your health care provider how often you should have your eyes checked.  Personal lifestyle choices, including:  Daily care of your teeth and gums.  Regular physical activity.  Eating a healthy diet.  Avoiding tobacco and drug use.  Limiting alcohol use.  Practicing safe sex.  Taking low-dose aspirin every day.  Taking vitamin and mineral supplements as recommended by your health care provider. What happens during an annual well  check? The services and screenings done by your health care provider during your annual well check will depend on your age, overall health, lifestyle risk factors, and family history of disease. Counseling  Your health care provider may ask you questions about your:  Alcohol use.  Tobacco use.  Drug use.  Emotional well-being.  Home and relationship well-being.  Sexual activity.  Eating habits.  History of falls.  Memory and ability to understand (cognition).  Work and work Statistician.  Reproductive health. Screening  You may have the following tests or measurements:  Height, weight, and BMI.  Blood pressure.  Lipid and cholesterol levels. These may be checked every 5 years, or more frequently if you are over 39 years old.  Skin check.  Lung cancer screening. You may have this screening every year starting at age 61 if you have a 30-pack-year history of smoking and currently smoke or have quit within the past 15 years.  Fecal occult blood test (FOBT) of the stool. You may have this test every year starting at age 43.  Flexible sigmoidoscopy or colonoscopy. You may have a sigmoidoscopy every 5 years or a colonoscopy every 10 years starting at age 34.  Hepatitis C blood test.  Hepatitis B blood test.  Sexually transmitted disease (STD) testing.  Diabetes screening. This is done by checking your blood sugar (glucose) after you have not eaten for a while (fasting). You may have this done every 1-3 years.  Bone density scan. This is done to screen for osteoporosis. You may have this done starting at age 70.  Mammogram. This may be done every 1-2 years. Talk to your health care provider about how often  you should have regular mammograms. Talk with your health care provider about your test results, treatment options, and if necessary, the need for more tests. Vaccines  Your health care provider may recommend certain vaccines, such as:  Influenza vaccine. This is  recommended every year.  Tetanus, diphtheria, and acellular pertussis (Tdap, Td) vaccine. You may need a Td booster every 10 years.  Zoster vaccine. You may need this after age 61.  Pneumococcal 13-valent conjugate (PCV13) vaccine. One dose is recommended after age 62.  Pneumococcal polysaccharide (PPSV23) vaccine. One dose is recommended after age 23. Talk to your health care provider about which screenings and vaccines you need and how often you need them. This information is not intended to replace advice given to you by your health care provider. Make sure you discuss any questions you have with your health care provider. Document Released: 07/29/2015 Document Revised: 03/21/2016 Document Reviewed: 05/03/2015 Elsevier Interactive Patient Education  2017 [place] Prevention in the Home Falls can cause injuries. They can happen to people of all ages. There are many things you can do to make your home safe and to help prevent falls. What can I do on the outside of my home?  Regularly fix the edges of walkways and driveways and fix any cracks.  Remove anything that might make you trip as you walk through a door, such as a raised step or threshold.  Trim any bushes or trees on the path to your home.  Use bright outdoor lighting.  Clear any walking paths of anything that might make someone trip, such as rocks or tools.  Regularly check to see if handrails are loose or broken. Make sure that both sides of any steps have handrails.  Any raised decks and porches should have guardrails on the edges.  Have any leaves, snow, or ice cleared regularly.  Use sand or salt on walking paths during winter.  Clean up any spills in your garage right away. This includes oil or grease spills. What can I do in the bathroom?  Use night lights.  Install grab bars by the toilet and in the tub and shower. Do not use towel bars as grab bars.  Use non-skid mats or decals in the tub or  shower.  If you need to sit down in the shower, use a plastic, non-slip stool.  Keep the floor dry. Clean up any water that spills on the floor as soon as it happens.  Remove soap buildup in the tub or shower regularly.  Attach bath mats securely with double-sided non-slip rug tape.  Do not have throw rugs and other things on the floor that can make you trip. What can I do in the bedroom?  Use night lights.  Make sure that you have a light by your bed that is easy to reach.  Do not use any sheets or blankets that are too big for your bed. They should not hang down onto the floor.  Have a firm chair that has side arms. You can use this for support while you get dressed.  Do not have throw rugs and other things on the floor that can make you trip. What can I do in the kitchen?  Clean up any spills right away.  Avoid walking on wet floors.  Keep items that you use a lot in easy-to-reach places.  If you need to reach something above you, use a strong step stool that has a grab bar.  Keep electrical cords  out of the way.  Do not use floor polish or wax that makes floors slippery. If you must use wax, use non-skid floor wax.  Do not have throw rugs and other things on the floor that can make you trip. What can I do with my stairs?  Do not leave any items on the stairs.  Make sure that there are handrails on both sides of the stairs and use them. Fix handrails that are broken or loose. Make sure that handrails are as long as the stairways.  Check any carpeting to make sure that it is firmly attached to the stairs. Fix any carpet that is loose or worn.  Avoid having throw rugs at the top or bottom of the stairs. If you do have throw rugs, attach them to the floor with carpet tape.  Make sure that you have a light switch at the top of the stairs and the bottom of the stairs. If you do not have them, ask someone to add them for you. What else can I do to help prevent  falls?  Wear shoes that:  Do not have high heels.  Have rubber bottoms.  Are comfortable and fit you well.  Are closed at the toe. Do not wear sandals.  If you use a stepladder:  Make sure that it is fully opened. Do not climb a closed stepladder.  Make sure that both sides of the stepladder are locked into place.  Ask someone to hold it for you, if possible.  Clearly mark and make sure that you can see:  Any grab bars or handrails.  First and last steps.  Where the edge of each step is.  Use tools that help you move around (mobility aids) if they are needed. These include:  Canes.  Walkers.  Scooters.  Crutches.  Turn on the lights when you go into a dark area. Replace any light bulbs as soon as they burn out.  Set up your furniture so you have a clear path. Avoid moving your furniture around.  If any of your floors are uneven, fix them.  If there are any pets around you, be aware of where they are.  Review your medicines with your doctor. Some medicines can make you feel dizzy. This can increase your chance of falling. Ask your doctor what other things that you can do to help prevent falls. This information is not intended to replace advice given to you by your health care provider. Make sure you discuss any questions you have with your health care provider. Document Released: 04/28/2009 Document Revised: 12/08/2015 Document Reviewed: 08/06/2014 Elsevier Interactive Patient Education  2017 [employer].

## 2020-10-18 NOTE — Progress Notes (Signed)
Established patient visit   Patient: Alexis [name]   DOB: May 15, 1956   65 y.o. Female  MRN: [id number] Visit Date: 10/18/2020  Today's healthcare provider: Lelon Huh, MD   No chief complaint on file.  Subjective    HPI  Follow up ER visit  Patient was seen in ER for headache and right ear pain on 10/03/2020. She was treated for tension headache. While in the ER CT of head was ordered and was negative. Patient was given vicodin for pain. A prescription for baclofen and tramadol was given upon discharge and patient was advised to f/u with regular doctor in 2 days if not improving. She reports excellent compliance with treatment. She reports this condition is Improved but not resolved. Her daughter joins the visit by phone today and also states that the patient has had a lot of trouble with her balance. She gets off balance when she stands up, but also when she is walking along, but has not yet fallen. She doesn't experience spinning sensation, but has had trouble hearing. Doesn't really describe it as light headedness or blacking out. No palpitations.   -----------------------------------------------------------------------------------------      Medications: Outpatient Medications Prior to Visit  Medication Sig  . Acetaminophen (TYLENOL PO) Take by mouth as needed.  Marland Kitchen aspirin EC 81 MG tablet Take 81 mg by mouth daily.  Marland Kitchen atorvastatin (LIPITOR) 20 MG tablet Take 1 tablet (20 mg total) by mouth daily.  Marland Kitchen conjugated estrogens (PREMARIN) vaginal cream Apply vaginally 2-3 times each week. (Patient not taking: Reported on 10/18/2020)  . gabapentin (NEURONTIN) 800 MG tablet Take 1 tablet by mouth twice daily  . hydrochlorothiazide (HYDRODIURIL) 25 MG tablet Take 1 tablet (25 mg total) by mouth daily.  Marland Kitchen lubiprostone (AMITIZA) 24 MCG capsule Take 24 mcg by mouth daily with breakfast.   . omeprazole (PRILOSEC) 40 MG capsule Take 1 capsule by mouth once daily  . terbinafine  (LAMISIL) 250 MG tablet Take 2 tablets (500 mg total) by mouth daily. Take for 1 week out of every 4 weeks (Patient not taking: Reported on 10/18/2020)  . traMADol (ULTRAM) 50 MG tablet Take 1 tablet (50 mg total) by mouth every 6 (six) hours as needed.  . vitamin B-12 (CYANOCOBALAMIN) 50 MCG tablet Take 50 mcg by mouth daily.  . Vitamin D, Ergocalciferol, (DRISDOL) 1.25 MG (50000 UNIT) CAPS capsule Take 1 capsule (50,000 Units total) by mouth every 7 (seven) days.   No facility-administered medications prior to visit.    Review of Systems     Objective    BP 117/67 (BP Location: Right Arm, Patient Position: Sitting, Cuff Size: Normal)   Pulse 72   Ht 5' 5.5" (1.664 m)   Wt 187 lb 6.4 oz (85 kg)   SpO2 100%   BMI 30.71 kg/m     Physical Exam    General: Appearance:    Mildly obese female in no acute distress  Eyes:    PERRL, conjunctiva/corneas clear, EOM's intact       HEENT:   Normal ear canals and TM, not obstructed, tender, swollen or red. She indicates right side of head around ear, temple and down to base of neck as location of her head and ear ache, but not tender. FROM of neck and no spine tenderness.   Lungs:     Clear to auscultation bilaterally, respirations unlabored  Heart:    Normal heart rate. Normal rhythm. No murmurs, rubs, or gallops.   MS:  All extremities are intact.   Neurologic:   Awake, alert, oriented x 3. Negative rhomberg. Difficulty with finger to nose. Unsteady walking gain. Normal s/s. No motor deficits.        Orthostatic Vitals for the past 48 hrs (Last 6 readings):  Patient Position Orthostatic BP Orthostatic Pulse  10/18/20 1107 Sitting -- --  10/18/20 1134 Supine 130/75 75  10/18/20 1136 Sitting 121/71 70  10/18/20 1139 Standing 128/82 85      Assessment & Plan     1. Chronic nonintractable headache, unspecified headache type Improved but not resolved since ER visit. Negative head CT.  - Sed Rate (ESR)  2. Balance problems Not typical  vertigo or orthostasis. Consider neuro referral if labs normal.   3. Dizziness  - CBC - Comprehensive metabolic panel  4. Right ear pain Normal exam, she may have neuralgia or less likely arteritis.   5. Ataxia  - Vitamin B12  6. Hearing difficulty of both ears Refer audiology. Dizziness is not c/w vestibular disorder.        The entirety of the information documented in the History of Present Illness, Review of Systems and Physical Exam were personally obtained by me. Portions of this information were initially documented by the CMA and reviewed by me for thoroughness and accuracy.      Lelon Huh, MD  [facility] [phone] (phone) [phone] (fax)  [place]

## 2020-10-18 NOTE — Patient Instructions (Addendum)
Please go to the lab draw station in [address] on the second floor of [facility]. Normal hours are 8:00am to 11:30am and 1:00pm to 4:00pm Monday through Friday  

## 2020-10-19 ENCOUNTER — Telehealth: Payer: Self-pay

## 2020-10-19 DIAGNOSIS — R519 Headache, unspecified: Secondary | ICD-10-CM

## 2020-10-19 DIAGNOSIS — R2689 Other abnormalities of gait and mobility: Secondary | ICD-10-CM

## 2020-10-19 LAB — COMPREHENSIVE METABOLIC PANEL
ALT: 25 IU/L (ref 0–32)
AST: 27 IU/L (ref 0–40)
Albumin/Globulin Ratio: 1.7 (ref 1.2–2.2)
Albumin: 4.5 g/dL (ref 3.8–4.8)
Alkaline Phosphatase: 67 IU/L (ref 44–121)
BUN/Creatinine Ratio: 14 (ref 12–28)
BUN: 12 mg/dL (ref 8–27)
Bilirubin Total: 0.4 mg/dL (ref 0.0–1.2)
CO2: 26 mmol/L (ref 20–29)
Calcium: 9.9 mg/dL (ref 8.7–10.3)
Chloride: 100 mmol/L (ref 96–106)
Creatinine, Ser: 0.84 mg/dL (ref 0.57–1.00)
Globulin, Total: 2.7 g/dL (ref 1.5–4.5)
Glucose: 87 mg/dL (ref 65–99)
Potassium: 4.8 mmol/L (ref 3.5–5.2)
Sodium: 142 mmol/L (ref 134–144)
Total Protein: 7.2 g/dL (ref 6.0–8.5)
eGFR: 77 mL/min/{1.73_m2} (ref >59)

## 2020-10-19 LAB — CBC
Hematocrit: 40.8 % (ref 34.0–46.6)
Hemoglobin: 13.4 g/dL (ref 11.1–15.9)
MCH: 28 pg (ref 26.6–33.0)
MCHC: 32.8 g/dL (ref 31.5–35.7)
MCV: 85 fL (ref 79–97)
Platelets: 295 10*3/uL (ref 150–450)
RBC: 4.78 x10E6/uL (ref 3.77–5.28)
RDW: 12.1 % (ref 11.7–15.4)
WBC: 6.9 10*3/uL (ref 3.4–10.8)

## 2020-10-19 LAB — VITAMIN B12: Vitamin B-12: >2000 pg/mL — ABNORMAL HIGH (ref 232–1245)

## 2020-10-19 LAB — SEDIMENTATION RATE: Sed Rate: 9 mm/hr (ref 0–40)

## 2020-10-19 NOTE — Telephone Encounter (Signed)
Patient advised of results. Patient states she doesn't want to go to [place] because she has limited transportation. Patient would like a referral to a local neurologist. She is willing to go to [facility] if she can see a different doctor other than Dr. Manuella Ghazi. Order placed. Please schedule.

## 2020-10-19 NOTE — Telephone Encounter (Signed)
-----   Message from Birdie Sons, MD sent at 10/19/2020 12:20 PM EDT ----- Labs are all normal. Recommend referral to [place] neurology for balance difficulties and headaches. (has seen dr Manuella Ghazi at [facility] before and does not want to go back there)

## 2020-10-20 DIAGNOSIS — H524 Presbyopia: Secondary | ICD-10-CM | POA: Diagnosis not present

## 2020-10-20 DIAGNOSIS — H40003 Preglaucoma, unspecified, bilateral: Secondary | ICD-10-CM | POA: Diagnosis not present

## 2020-10-26 ENCOUNTER — Telehealth: Payer: Self-pay

## 2020-10-26 NOTE — Progress Notes (Signed)
Chronic Care Management Pharmacy Assistant   Name: Alexis [name]  MRN: [id number] DOB: [birth year]  Reason for [place]  Call.   Recent office visits:  10/18/2020 PCP Lelon Huh   Recent consult visits:  None ID  Hospital visits:  Medication Reconciliation was completed by comparing discharge summary, patient's EMR and Pharmacy list, and upon discussion with patient.  Admitted to the hospital on 10/03/2020 due to Tension Headache. Discharge date was 10/03/2020. Discharged from [place]?Medications Started at [facility] Discharge:?? -started baclofen 10 mg , Tramadol 50 mg   Medication Changes at Hospital Discharge: -Changed None  Medications Discontinued at Hospital Discharge: -Stopped None   Medications that remain the same after Hospital Discharge:??  -All other medications will remain the same.    Medications: Outpatient Encounter Medications as of 10/26/2020  Medication Sig  . Acetaminophen (TYLENOL PO) Take by mouth as needed.  Marland Kitchen aspirin EC 81 MG tablet Take 81 mg by mouth daily.  Marland Kitchen atorvastatin (LIPITOR) 20 MG tablet Take 1 tablet (20 mg total) by mouth daily.  Marland Kitchen conjugated estrogens (PREMARIN) vaginal cream Apply vaginally 2-3 times each week.  . gabapentin (NEURONTIN) 800 MG tablet Take 1 tablet by mouth twice daily  . hydrochlorothiazide (HYDRODIURIL) 25 MG tablet Take 1 tablet (25 mg total) by mouth daily.  Marland Kitchen lubiprostone (AMITIZA) 24 MCG capsule Take 24 mcg by mouth daily with breakfast.   . omeprazole (PRILOSEC) 40 MG capsule Take 1 capsule by mouth once daily  . terbinafine (LAMISIL) 250 MG tablet Take 2 tablets (500 mg total) by mouth daily. Take for 1 week out of every 4 weeks  . traMADol (ULTRAM) 50 MG tablet Take 1 tablet (50 mg total) by mouth every 6 (six) hours as needed. (Patient not taking: Reported on 10/18/2020)  . vitamin B-12 (CYANOCOBALAMIN) 50 MCG tablet Take 50 mcg by mouth daily.  . Vitamin D,  Ergocalciferol, (DRISDOL) 1.25 MG (50000 UNIT) CAPS capsule Take 1 capsule (50,000 Units total) by mouth every 7 (seven) days.   No facility-administered encounter medications on file as of 10/26/2020.    Star Rating Drugs: Atorvastatin 20 mg last filled on 05/11/2020 for 90 day supply at [facility].  Reviewed chart prior to disease state call. Spoke with patient regarding BP  Recent Office Vitals: BP Readings from Last 3 Encounters:  10/18/20 117/67  10/03/20 113/64  04/20/20 116/75   Pulse Readings from Last 3 Encounters:  10/18/20 72  10/03/20 (!) 52  04/20/20 60    Wt Readings from Last 3 Encounters:  10/18/20 187 lb 6.4 oz (85 kg)  10/03/20 186 lb (84.4 kg)  04/20/20 194 lb (88 kg)     Kidney Function Lab Results  Component Value Date/Time   CREATININE 0.84 10/18/2020 01:30 PM   CREATININE 1.04 (H) 04/25/2020 11:03 AM   CREATININE 0.83 06/02/2012 11:08 AM   GFRNONAA 56 (L) 04/25/2020 11:03 AM   GFRAA 65 04/25/2020 11:03 AM    BMP Latest Ref Rng & Units 10/18/2020 04/25/2020 02/11/2019  Glucose 65 - 99 mg/dL 87 87 91  BUN 8 - 27 mg/dL 12 19 13   Creatinine 0.57 - 1.00 mg/dL 0.84 1.04(H) 0.99  BUN/Creat Ratio 12 - 28 14 18 13   Sodium 134 - 144 mmol/L 142 139 140  Potassium 3.5 - 5.2 mmol/L 4.8 4.0 4.5  Chloride 96 - 106 mmol/L 100 100 103  CO2 20 - 29 mmol/L 26 26 25   Calcium 8.7 -  10.3 mg/dL 9.9 10.1 9.3    . Current antihypertensive regimen:   HCTZ 25 mg daily  . How often are you checking your Blood Pressure? infrequently . Current home BP readings:  o Patient states she checks her blood pressure sometimes,mainly when she goes to [employer].Patient states she is unsure what her blood pressure has been. . What recent interventions/DTPs have been made by any provider to improve Blood Pressure control since last CPP Visit: None ID . Any recent hospitalizations or ED visits since last visit with CPP? No . What diet changes have been made to improve Blood  Pressure Control?  o No Changes indicated. . What exercise is being done to improve your Blood Pressure Control?  o Patient states she walks for exercise.  Adherence Review: Is the patient currently on ACE/ARB medication? No Does the patient have >5 day gap between last estimated fill dates? No  Anderson Malta Clinical Production designer, theatre/television/film [phone]

## 2020-11-03 ENCOUNTER — Other Ambulatory Visit: Payer: Self-pay | Admitting: Family Medicine

## 2020-11-08 ENCOUNTER — Other Ambulatory Visit: Payer: Self-pay | Admitting: Family Medicine

## 2020-11-09 ENCOUNTER — Other Ambulatory Visit: Payer: Self-pay | Admitting: Family Medicine

## 2020-11-11 DIAGNOSIS — H903 Sensorineural hearing loss, bilateral: Secondary | ICD-10-CM | POA: Diagnosis not present

## 2020-11-14 ENCOUNTER — Telehealth: Payer: Self-pay

## 2020-11-14 MED ORDER — HYDROCHLOROTHIAZIDE 25 MG PO TABS: 1.0000 | ORAL_TABLET | Freq: Every day | ORAL | 4 refills | Status: AC

## 2020-11-14 NOTE — Telephone Encounter (Signed)
Copied from [place] [id number]. Topic: General - Other >> Nov 14, 2020 11:37 AM Mcneil, Ja-Kwan wrote: Reason for CRM: Pt stated she contacted her pharmacy because the Rx for hydrochlorothiazide (HYDRODIURIL) 25 MG tablet was not in her bag with the other medications. Pt stated the pharmacy is telling her a few different things in regards to the Rx and advised her to contact provider. Pt would like to requests a new Rx for hydrochlorothiazide (HYDRODIURIL) 25 MG tablet to be sent to [place], [place].

## 2020-11-15 ENCOUNTER — Other Ambulatory Visit: Payer: Self-pay | Admitting: Family Medicine

## 2020-11-29 DIAGNOSIS — H52223 Regular astigmatism, bilateral: Secondary | ICD-10-CM | POA: Diagnosis not present

## 2020-11-29 DIAGNOSIS — H524 Presbyopia: Secondary | ICD-10-CM | POA: Diagnosis not present

## 2021-01-30 ENCOUNTER — Ambulatory Visit: Admit: 2021-01-30 | Payer: Medicare HMO | Admitting: Internal Medicine

## 2021-01-30 SURGERY — COLONOSCOPY WITH PROPOFOL
Anesthesia: General

## 2021-02-06 ENCOUNTER — Other Ambulatory Visit: Payer: Self-pay | Admitting: Family Medicine

## 2021-02-10 ENCOUNTER — Other Ambulatory Visit: Payer: Self-pay | Admitting: Family Medicine

## 2021-02-13 ENCOUNTER — Other Ambulatory Visit: Payer: Self-pay | Admitting: Family Medicine

## 2021-02-13 NOTE — Telephone Encounter (Signed)
Medication: hydrochlorothiazide (HYDRODIURIL) 25 MG tablet [id number]   Has the patient contacted their pharmacy? YES (Agent: If no, request that the patient contact the pharmacy for the refill.) (Agent: If yes, when and what did the pharmacy advise?)  Preferred Pharmacy (with phone number or street name): [place], [place] - [address] [place] [address] Phone: [phone] Fax: [phone] Hours: Not open 24 hours    Agent: Please be advised that RX refills may take up to 3 business days. We ask that you follow-up with your pharmacy.

## 2021-02-14 NOTE — Telephone Encounter (Signed)
Filled 11/14/20 # 90 with 4 refills.

## 2021-02-14 NOTE — Telephone Encounter (Signed)
[place] called and spoke to [place] about the refill(s) Hydrochlorthiazide requested. Advised it was sent on 11/14/20 #90/1 refill(s). She says they do have it and the system is generating a request, so she will cancel the request on their end.

## 2021-02-15 DIAGNOSIS — L731 Pseudofolliculitis barbae: Secondary | ICD-10-CM | POA: Diagnosis not present

## 2021-02-15 DIAGNOSIS — L821 Other seborrheic keratosis: Secondary | ICD-10-CM | POA: Diagnosis not present

## 2021-02-24 ENCOUNTER — Telehealth: Payer: Self-pay

## 2021-02-24 NOTE — Progress Notes (Signed)
    Chronic Care Management Pharmacy Assistant   Name: Alexis [name]  MRN: [id number] DOB: 1955/09/24   Patient called to be reminded of her appointment with Junius Argyle, CPP on 02/27/2021 '@1530'$  via telephone.  Patient advised that she was out of town and requested to reschedule her appointment. Appointment has been rescheduled for 04/03/2021 at 1500. Patient denies having any illness or any symptoms related to her diagnosis. Patient stated that she is doing well. She denies needing any refills at this time. I provided patient with my direct number just in case she needed anything prior to her September appointment.   Star Rating Drug: Atorvastatin 20 mg last filled on 11/09/2020 for a 90-Day supply with [facility]  Any gaps in medications fill history? Yes  Care Gaps: Zoster Vaccines- Shingrix PNA vac Low Risk Adult  COLONOSCOPY (last completed on 02/14/2015) COVID-19 Vaccine Booster 4 INFLUENZA VACCINE (last completed on 06/21/2020)

## 2021-02-27 ENCOUNTER — Telehealth: Payer: Self-pay

## 2021-03-08 IMAGING — CR LUMBAR SPINE - COMPLETE 4+ VIEW
1 series · 5 of 5 positions shown · non-contrast
Comparison: January 01, 2014 lumbar radiographs and lumbar MRI November 19, 2014

CLINICAL DATA: Lumbago with left-sided radicular symptoms

EXAM:
LUMBAR SPINE - COMPLETE 4+ VIEW

[Series 1: dg lumbar spine complete 4 +v · 0.14mm/px · 5 of 5 slices shown]
[im 1/5]
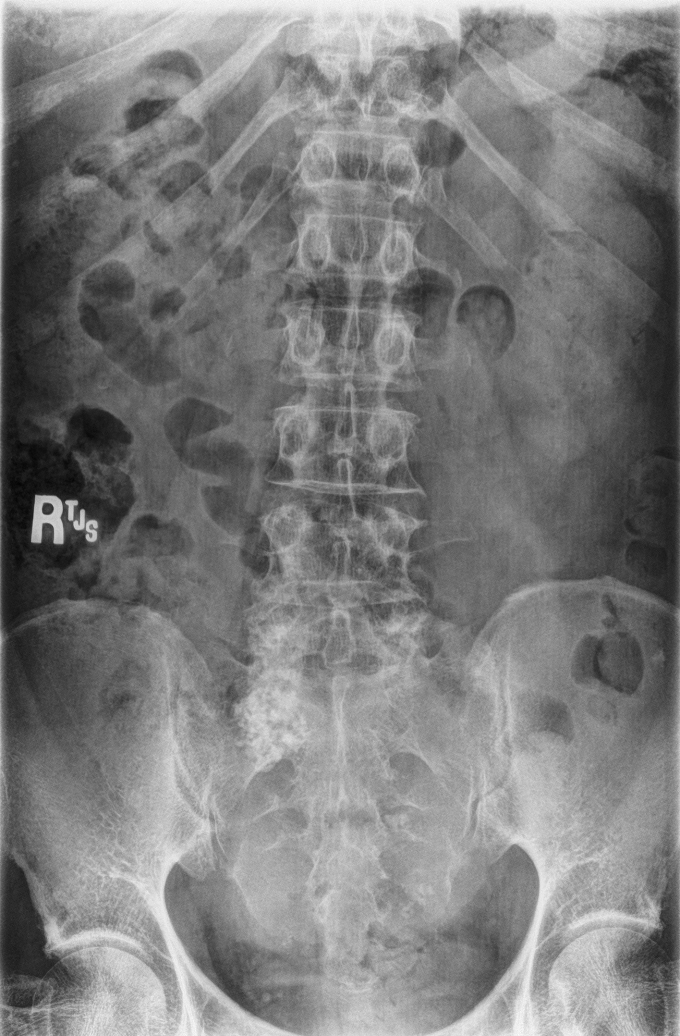
[im 2/5]
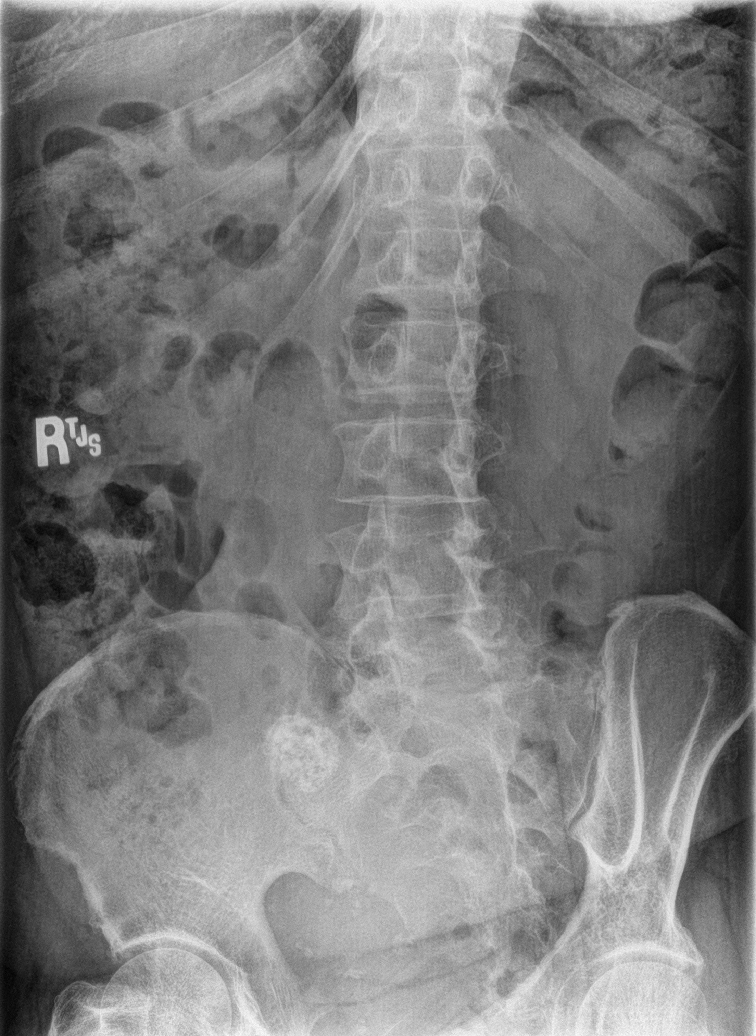
[im 3/5]
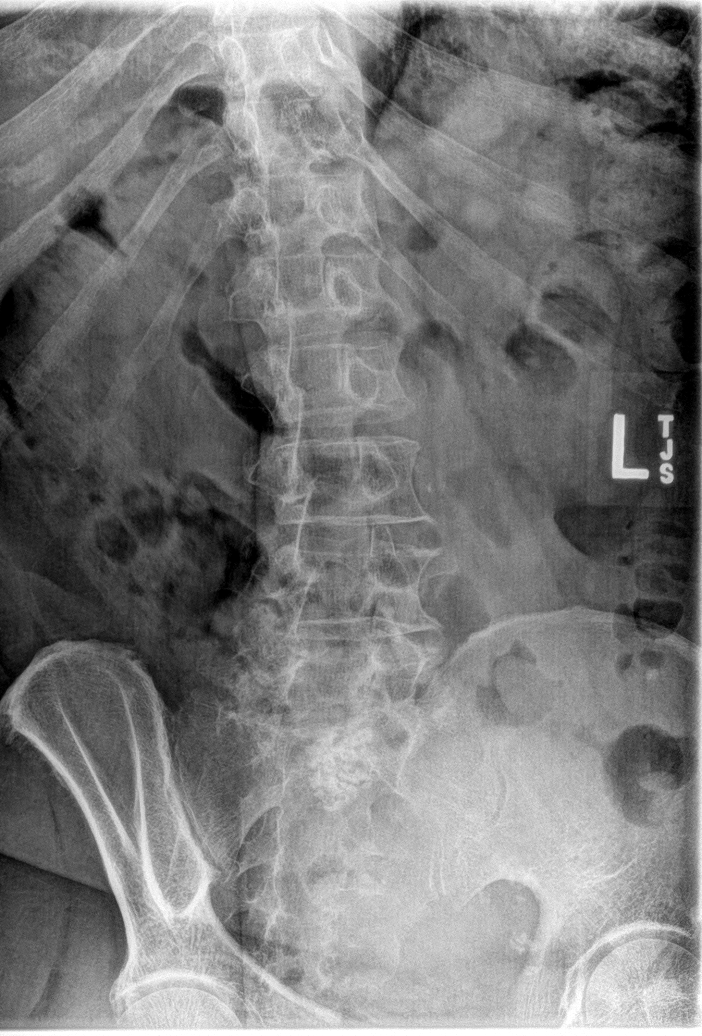
[im 4/5]
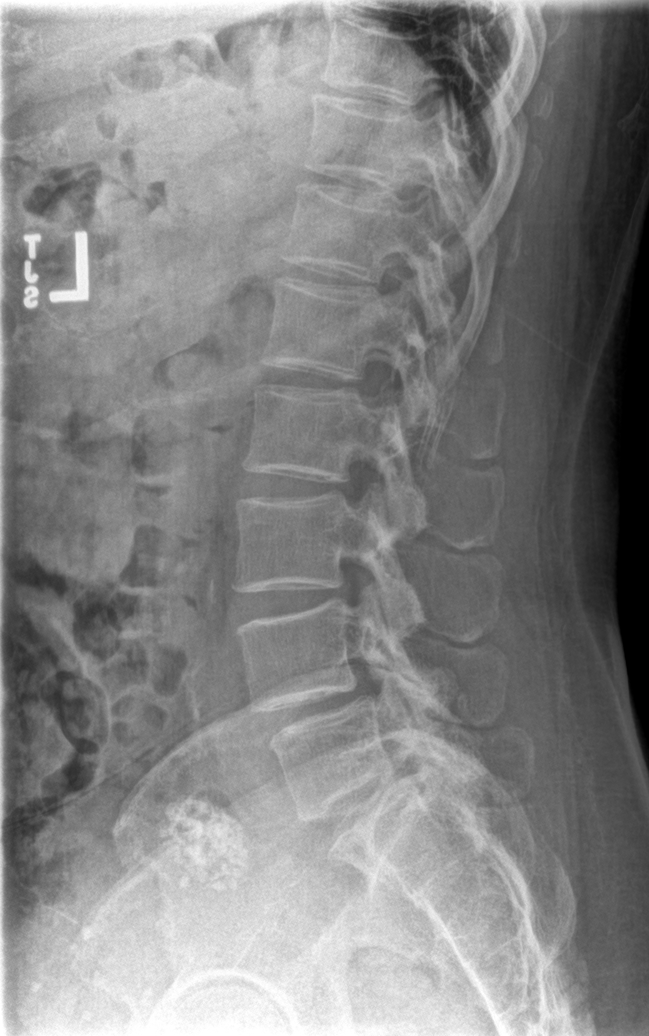
[im 5/5]
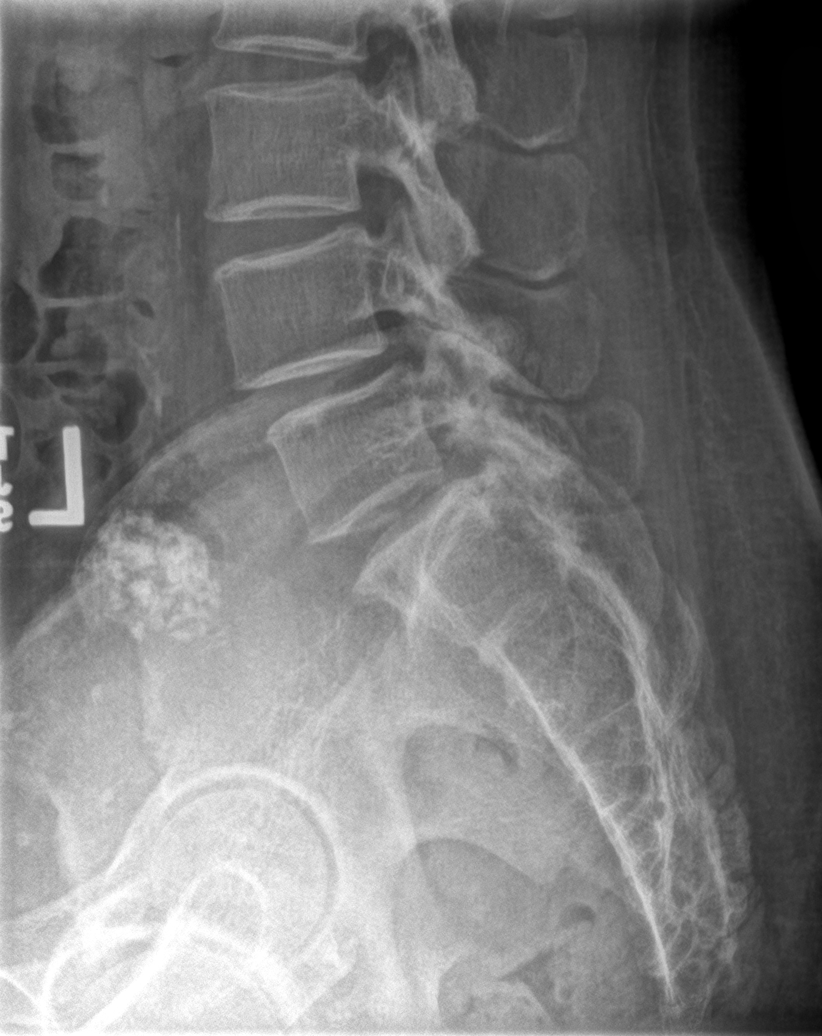

[5 of 5 positions shown; findings below may reference images not displayed]

FINDINGS: Frontal, lateral, spot lumbosacral lateral, and bilateral oblique
views were obtained. There are 5 non-rib-bearing lumbar type
vertebral bodies. There is no appreciable fracture. There is 2 mm of
anterolisthesis of L4 on L5. There is 2 mm of anterolisthesis of L5
on S1. No other spondylolisthesis evident. There is moderate disc
space narrowing at L5-S1. Other disc spaces appear unremarkable.
There is facet osteoarthritic change at L4-5 and L5-S1 bilaterally.

A calcified uterine leiomyoma is noted in the rightward aspect of
the pelvis, similar in appearance to the 3282 study.
IMPRESSION: Slight spondylolisthesis at L4-5 and L5-S1, likely due to underlying
spondylosis. Areas of osteoarthritic change at L4-5 and L5-S1. No
fracture evident.

Calcified uterine leiomyoma in the rightward aspect of the upper
pelvis again noted.

## 2021-04-03 ENCOUNTER — Telehealth: Payer: Self-pay

## 2021-04-03 ENCOUNTER — Telehealth: Payer: Medicare HMO

## 2021-04-03 NOTE — Progress Notes (Signed)
    Chronic Care Management Pharmacy Assistant   Name: Alexis [name]  MRN: [id number] DOB: 12-Feb-1956  Patient called to be reminded of her appointment with Junius Argyle, CPP on 04/04/2021 '@0900'$  via telephone.  Spoke with patient and she advised that she would need to cancel this appointment as she was in [place] on a family emergency and she would not be back until the end of the month. The patient requested I give her a call back at the end of the month to reschedule as she has a lot going on at this time concerning her sister. I advised patient I would call her back towards the end of the month and informed he she could call me if she needs anything in the mean time.   Star Rating Drug: Atorvastatin 20 mg last filled on 02/10/2021 for a 90-Day supply with [facility]   Any gaps in medications fill history? No  Care Gaps: Zoster Vaccine Colonoscopy COVID-19 Vaccine Booster 4 Influenza Vaccine (last completed 06/21/2020)  Lynann Bologna, CPA/CMA Clinical Pharmacist Assistant Phone: [phone]

## 2021-04-04 ENCOUNTER — Telehealth: Payer: Medicare HMO

## 2021-05-03 ENCOUNTER — Other Ambulatory Visit: Payer: Self-pay | Admitting: Family Medicine

## 2021-05-03 DIAGNOSIS — M79604 Pain in right leg: Secondary | ICD-10-CM

## 2021-06-05 ENCOUNTER — Other Ambulatory Visit: Payer: Self-pay | Admitting: Family Medicine

## 2021-06-05 DIAGNOSIS — Z1231 Encounter for screening mammogram for malignant neoplasm of breast: Secondary | ICD-10-CM

## 2021-08-02 ENCOUNTER — Telehealth: Payer: Self-pay

## 2021-08-03 NOTE — Progress Notes (Signed)
Chronic Care Management Pharmacy Assistant   Name: Alexis [name]  MRN: [id number] DOB: [birth year]  Reason for Encounter: Hypertension Disease State/Schedule Follow-up with CPP   Recent office visits:  None ID  Recent consult visits:  None ID  Hospital visits:  None in previous 6 months  Medications: Outpatient Encounter Medications as of 08/02/2021  Medication Sig   Acetaminophen (TYLENOL PO) Take by mouth as needed.   aspirin EC 81 MG tablet Take 81 mg by mouth daily.   atorvastatin (LIPITOR) 20 MG tablet Take 1 tablet by mouth once daily   conjugated estrogens (PREMARIN) vaginal cream Apply vaginally 2-3 times each week.   gabapentin (NEURONTIN) 800 MG tablet Take 1 tablet by mouth twice daily   hydrochlorothiazide (HYDRODIURIL) 25 MG tablet Take 1 tablet (25 mg total) by mouth daily.   lubiprostone (AMITIZA) 24 MCG capsule Take 24 mcg by mouth daily with breakfast.    omeprazole (PRILOSEC) 40 MG capsule Take 1 capsule by mouth once daily   terbinafine (LAMISIL) 250 MG tablet Take 2 tablets (500 mg total) by mouth daily. Take for 1 week out of every 4 weeks   traMADol (ULTRAM) 50 MG tablet Take 1 tablet (50 mg total) by mouth every 6 (six) hours as needed. (Patient not taking: Reported on 10/18/2020)   vitamin B-12 (CYANOCOBALAMIN) 50 MCG tablet Take 50 mcg by mouth daily.   Vitamin D, Ergocalciferol, (DRISDOL) 1.25 MG (50000 UNIT) CAPS capsule Take 1 capsule (50,000 Units total) by mouth every 7 (seven) days.   No facility-administered encounter medications on file as of 08/02/2021.    Care Gaps: Zoster Vaccine PNA Vaccine Colonoscopy COVID-19 Booster 4 Influenza Vaccine  Star Rating Drugs: Atorvastatin 20 mg last filled on 05/12/2021 for a 90-Day supply with [facility]  Reviewed chart prior to disease state call. Spoke with patient regarding BP  Recent Office Vitals: BP Readings from Last 3 Encounters:  10/18/20 117/67  10/03/20 113/64  04/20/20 116/75    Pulse Readings from Last 3 Encounters:  10/18/20 72  10/03/20 (!) 52  04/20/20 60    Wt Readings from Last 3 Encounters:  10/18/20 187 lb 6.4 oz (85 kg)  10/03/20 186 lb (84.4 kg)  04/20/20 194 lb (88 kg)     Kidney Function Lab Results  Component Value Date/Time   CREATININE 0.84 10/18/2020 01:30 PM   CREATININE 1.04 (H) 04/25/2020 11:03 AM   CREATININE 0.83 06/02/2012 11:08 AM   GFRNONAA 56 (L) 04/25/2020 11:03 AM   GFRAA 65 04/25/2020 11:03 AM    BMP Latest Ref Rng & Units 10/18/2020 04/25/2020 02/11/2019  Glucose 65 - 99 mg/dL 87 87 91  BUN 8 - 27 mg/dL 12 19 13   Creatinine 0.57 - 1.00 mg/dL 0.84 1.04(H) 0.99  BUN/Creat Ratio 12 - 28 14 18 13   Sodium 134 - 144 mmol/L 142 139 140  Potassium 3.5 - 5.2 mmol/L 4.8 4.0 4.5  Chloride 96 - 106 mmol/L 100 100 103  CO2 20 - 29 mmol/L 26 26 25   Calcium 8.7 - 10.3 mg/dL 9.9 10.1 9.3    Current antihypertensive regimen:  HCTZ 25 mg 1 tablet daily  How often are you checking your Blood Pressure? infrequently  Current home BP readings: Patient was not home. Patient was heading to her Mammogram appointment so she was not able to give me a reading. Patient does not check her BP on a regular. She did state she does need to schedule a PCP appointment as she  has not been seen in a while.  What recent interventions/DTPs have been made by any provider to improve Blood Pressure control since last CPP Visit: None ID  Any recent hospitalizations or ED visits since last visit with CPP? No  What diet changes have been made to improve Blood Pressure Control?  Patient is on a normal diet. She stated her appetite is good.  What exercise is being done to improve your Blood Pressure Control?  Patient does not exercise  Adherence Review: Is the patient currently on ACE/ARB medication? No Does the patient have >5 day gap between last estimated fill dates? No  Patient reports that she is doing well. She does report her energy level is low  lately. She is eating normally with no issues. Patient did state she has to schedule an appointment to see her PCP as it has been awhile. Patient was about to go in to get her Mammogram, but stated that for the most part she has been doing okay. Patient was agreeable to a telephone visit with CPP on 08/11/2021 @ 1100. Appointment has been scheduled. Patient denies needing medication refills at this time.   Lynann Bologna, CPA/CMA Clinical Pharmacist Assistant Phone: [phone]

## 2021-08-09 ENCOUNTER — Ambulatory Visit
Admission: RE | Admit: 2021-08-09 | Discharge: 2021-08-09 | Disposition: A | Discharge status: Home / Self Care | Payer: Medicare HMO | Source: Ambulatory Visit | Attending: Family Medicine | Admitting: Family Medicine

## 2021-08-09 ENCOUNTER — Other Ambulatory Visit: Payer: Self-pay

## 2021-08-09 DIAGNOSIS — Z1231 Encounter for screening mammogram for malignant neoplasm of breast: Secondary | ICD-10-CM | POA: Insufficient documentation

## 2021-08-11 ENCOUNTER — Ambulatory Visit (INDEPENDENT_AMBULATORY_CARE_PROVIDER_SITE_OTHER): Payer: Medicare HMO

## 2021-08-11 DIAGNOSIS — I10 Essential (primary) hypertension: Secondary | ICD-10-CM

## 2021-08-11 DIAGNOSIS — E7800 Pure hypercholesterolemia, unspecified: Secondary | ICD-10-CM

## 2021-08-11 NOTE — Patient Instructions (Signed)
Visit Information It was great speaking with you today!  Please let me know if you have any questions about our visit.   Goals Addressed             This Visit's Progress    Track and Manage My Blood Pressure-Hypertension   On track    Timeframe:  Long-Range Goal Priority:  High Start Date:  09/05/2020                           Expected End Date: 03/06/2023                      Follow Up within 90 days   - check blood pressure weekly    Why is this important?   You won't feel high blood pressure, but it can still hurt your blood vessels.  High blood pressure can cause heart or kidney problems. It can also cause a stroke.  Making lifestyle changes like losing a little weight or eating less salt will help.  Checking your blood pressure at home and at different times of the day can help to control blood pressure.  If the doctor prescribes medicine remember to take it the way the doctor ordered.  Call the office if you cannot afford the medicine or if there are questions about it.     Notes:         Patient Care Plan: General Pharmacy (Adult)     Problem Identified: Hypertension, Hyperlipidemia, Depression and Insomnia, Chronic Pain, Constipation   Priority: High     Long-Range Goal: Patient-Specific Goal   Start Date: 09/05/2020  Expected End Date: 08/11/2022  This Visit's Progress: On track  Recent Progress: On track  Priority: High  Note:   Current Barriers:  No barriers noted  Pharmacist Clinical Goal(s):  Over the next 90 days, patient will maintain control of blood pressure  as evidenced by BP less 140/90  through collaboration with PharmD and provider.   Interventions: 1:1 collaboration with Birdie Sons, MD regarding development and update of comprehensive plan of care as evidenced by provider attestation and co-signature Inter-disciplinary care team collaboration (see longitudinal plan of care) Comprehensive medication review performed; medication list  updated in electronic medical record  Hypertension (BP goal <140/90) -controlled -Current treatment: HCTZ 25 mg daily: Appropriate, Effective, Safe, Accessible -Medications previously tried: NA  -Current home readings: does not have home monitor  -reports some dizziness when she first wakes up.  -Patient endorses balance concerns, thinks it is related to her legs since her stroke.  -Current dietary habits: Does not follow any specific dietary regimen -Current exercise habits: Does not follow any specific exercise regimen  -Denies hypotensive/hypertensive symptoms -Educated on Daily salt intake goal < 2300 mg; -Counseled to monitor BP at home weekly, document, and provide log at future appointments -Recommended to continue current medication  Hyperlipidemia: (LDL goal < 100) -controlled -History of stroke w/ leg and balance deficits  -Current treatment: Atorvastatin 20 mg daily: Appropriate, Effective, Safe, Accessible  -Medications previously tried: NA  -Educated on Importance of limiting foods high in cholesterol; -Recommended to continue current medication  Osteoarthritis (Goal: Minimize pain symptoms) -Controlled -Current treatment  Acetaminophen 500 mg twice daily: Appropriate, Effective, Safe, Accessible  Gabapentin 800 mg twice daily: Appropriate, Effective, Safe, Accessible  -Medications previously tried: NA  -Counseled patient she can try Arthritis Tylenol or increase acetaminophen to 1-2 tablets three times daily as needed. Counseled  to limit acetaminophen use to 3000 mg daily.   -Recommended to continue current medication  Patient Goals/Self-Care Activities Over the next 90 days, patient will:  - check blood pressure weekly, document, and provide at future appointments  Follow Up Plan: Telephone follow up appointment with care management team member scheduled for:  06/18/2022 at 11:00 AM    Patient agreed to services and verbal consent obtained.   Patient  verbalizes understanding of instructions and care plan provided today and agrees to view in [facility]. Active MyChart status confirmed with patient.    Junius Argyle, PharmD, Para March, CPP  Clinical Pharmacist Practitioner  [facility] [phone]

## 2021-08-11 NOTE — Progress Notes (Signed)
Chronic Care Management Pharmacy Note  08/11/2021 Name:  TORRIE NAMBA MRN:  [id number] DOB:  12-03-1955  Subjective: Alexis [name] is an 66 y.o. year old female who is a primary patient of Fisher, Kirstie Peri, MD.  The CCM team was consulted for assistance with disease management and care coordination needs.    Engaged with patient by telephone for follow up visit in response to provider referral for pharmacy case management and/or care coordination services.   Consent to Services:  The patient was given information about Chronic Care Management services, agreed to services, and gave verbal consent prior to initiation of services.  Please see initial visit note for detailed documentation.   Patient Care Team: Birdie Sons, MD as PCP - General (Family Medicine) Lorelee Cover., MD (Ophthalmology) [name], MD as Referring Physician (Obstetrics and Gynecology) Germaine Pomfret, [facility] as Pharmacist (Pharmacist)  Recent office visits: 04/30/20: Patient presented to Dr. Caryn Section for follow-up.   Recent consult visits: None in previous 6 months  Hospital visits: None in previous 6 months  Objective:  Lab Results  Component Value Date   CREATININE 0.84 10/18/2020   BUN 12 10/18/2020   GFRNONAA 56 (L) 04/25/2020   GFRAA 65 04/25/2020   NA 142 10/18/2020   K 4.8 10/18/2020   CALCIUM 9.9 10/18/2020   CO2 26 10/18/2020    Lab Results  Component Value Date/Time   HGBA1C 6.1 (H) 02/11/2019 10:58 AM   HGBA1C 5.9 (H) 12/26/2017 10:56 AM    Last diabetic Eye exam: No results found for: HMDIABEYEEXA  Last diabetic Foot exam: No results found for: HMDIABFOOTEX   Lab Results  Component Value Date   CHOL 154 04/25/2020   HDL 54 04/25/2020   LDLCALC 83 04/25/2020   TRIG 88 04/25/2020   CHOLHDL 2.9 04/25/2020    Hepatic Function Latest Ref Rng & Units 10/18/2020 04/25/2020 02/11/2019  Total Protein 6.0 - 8.5 g/dL 7.2 7.3 7.1  Albumin 3.8 - 4.8 g/dL 4.5 4.4 4.4  AST 0  - 40 IU/L _0 ALT 0 - 32 IU/L _1 Alk Phosphatase 44 - 121 IU/L 67 58 62  Total Bilirubin 0.0 - 1.2 mg/dL 0.4 0.4 0.6    Lab Results  Component Value Date/Time   TSH 0.962 02/11/2019 10:58 AM   TSH 0.968 05/23/2016 04:03 PM    CBC Latest Ref Rng & Units 10/18/2020 04/25/2020 07/24/2016  WBC 3.4 - 10.8 x10E3/uL 6.9 7.5 5.0  Hemoglobin 11.1 - 15.9 g/dL 13.4 13.2 13.2  Hematocrit 34.0 - 46.6 % 40.8 38.7 40.3  Platelets 150 - 450 x10E3/uL 295 326 297    Lab Results  Component Value Date/Time   VD25OH 34.3 04/25/2020 11:03 AM   VD25OH 51.1 02/11/2019 10:58 AM    Clinical ASCVD: Yes  The ASCVD Risk score (Arnett DK, et al., 2019) failed to calculate for the following reasons:   The patient has a prior MI or stroke diagnosis    Depression screen [facility] 2/9 10/18/2020 10/18/2020 04/20/2020  Decreased Interest 1 0 2  Down, Depressed, Hopeless 1 0 1  PHQ - 2 Score 2 0 3  Altered sleeping 2 - 2  Tired, decreased energy 2 - 2  Change in appetite 2 - 2  Feeling bad or failure about yourself  0 - 1  Trouble concentrating 0 - 2  Moving slowly or fidgety/restless 0 - 2  Suicidal thoughts 0 - 0  PHQ-9 Score 8 -  14  Difficult doing work/chores Not difficult at all - Somewhat difficult     Social History   Tobacco Use  Smoking Status Never  Smokeless Tobacco Never   BP Readings from Last 3 Encounters:  10/18/20 117/67  10/03/20 113/64  04/20/20 116/75   Pulse Readings from Last 3 Encounters:  10/18/20 72  10/03/20 (!) 52  04/20/20 60   Wt Readings from Last 3 Encounters:  10/18/20 187 lb 6.4 oz (85 kg)  10/03/20 186 lb (84.4 kg)  04/20/20 194 lb (88 kg)    Assessment/Interventions: Review of patient past medical history, allergies, medications, health status, including review of consultants reports, laboratory and other test data, was performed as part of comprehensive evaluation and provision of chronic care management services.   SDOH:  (Social Determinants of  Health) assessments and interventions performed: Yes SDOH Interventions    Flowsheet Row Most Recent Value  SDOH Interventions   Financial Strain Interventions Intervention Not Indicated        CCM Care Plan  No Known Allergies  Medications Reviewed Today     Reviewed by Germaine Pomfret, [place] (Pharmacist) on 08/11/21 at 1117  Med List Status: <None>   Medication Order Taking? Sig Documenting Provider Last Dose Status Informant  acetaminophen (TYLENOL) 500 MG tablet [id number] Yes Take 500 mg by mouth in the morning and at bedtime. [provider] Taking Active   aspirin EC 81 MG tablet [id number] Yes Take 81 mg by mouth daily. [provider] Taking Active   atorvastatin (LIPITOR) 20 MG tablet [id number] Yes Take 1 tablet by mouth once daily Fisher, Kirstie Peri, MD Taking Active   gabapentin (NEURONTIN) 800 MG tablet [id number] Yes Take 1 tablet by mouth twice daily Fisher, Kirstie Peri, MD Taking Active   hydrochlorothiazide (HYDRODIURIL) 25 MG tablet [id number] Yes Take 1 tablet (25 mg total) by mouth daily. Birdie Sons, MD Taking Active   vitamin B-12 (CYANOCOBALAMIN) 50 MCG tablet [id number] Yes Take 50 mcg by mouth daily. [provider] Taking Active             Patient Active Problem List   Diagnosis Date Noted   History of kidney stones 02/11/2018   Epigastric pain 07/24/2016   Family history of early CAD 11/09/2015   Contact with or exposure to communicable disease 11/09/2015   Degeneration of cervical intervertebral disc 11/09/2015   Insomnia 11/09/2015   Abnormal results of thyroid function studies 11/09/2015   Asymptomatic varicose veins 11/09/2015   Vitamin D deficiency 11/09/2015   Abnormality of gait 11/09/2015   History of adenomatous polyp of colon 02/28/2015   Fibroids 01/04/2015   Memory difficulty 01/04/2015   Hearing loss    Cerebrovascular disease    Esophageal reflux    Obstructive sleep apnea    Urinary incontinence     Depression    LBP (low back pain) 12/07/2014   Benign neoplasm of colon 10/13/2013   Cervical pain 10/13/2013   Chronic constipation 10/13/2013   Benign essential HTN 10/13/2013   Cephalalgia 10/13/2013   Hypercholesteremia 10/13/2013   Abnormal mental state 10/13/2013   Cerebral artery occlusion with cerebral infarction ([place]) 12/03/2006    Immunization History  Administered Date(s) Administered   Fluad Quad(high Dose 65+) 06/21/2020   Influenza,inj,Quad PF,6+ Mos 05/23/2016, 05/22/2017   PFIZER(Purple Top)SARS-COV-2 Vaccination 08/29/2019, 09/19/2019, 05/19/2020    Conditions to be addressed/monitored:  Hypertension, Hyperlipidemia, Depression and Insomnia, Chronic Pain, Constipation  Care Plan : General Pharmacy (Adult)  Updates made by  Germaine Pomfret, RPH since 08/11/2021 12:00 AM     Problem: Hypertension, Hyperlipidemia, Depression and Insomnia, Chronic Pain, Constipation   Priority: High     Long-Range Goal: Patient-Specific Goal   Start Date: 09/05/2020  Expected End Date: 08/11/2022  This Visit's Progress: On track  Recent Progress: On track  Priority: High  Note:   Current Barriers:  No barriers noted  Pharmacist Clinical Goal(s):  Over the next 90 days, patient will maintain control of blood pressure  as evidenced by BP less 140/90  through collaboration with PharmD and provider.   Interventions: 1:1 collaboration with Birdie Sons, MD regarding development and update of comprehensive plan of care as evidenced by provider attestation and co-signature Inter-disciplinary care team collaboration (see longitudinal plan of care) Comprehensive medication review performed; medication list updated in electronic medical record  Hypertension (BP goal <140/90) -controlled -Current treatment: HCTZ 25 mg daily: Appropriate, Effective, Safe, Accessible -Medications previously tried: NA  -Current home readings: does not have home monitor  -reports some dizziness  when she first wakes up.  -Patient endorses balance concerns, thinks it is related to her legs since her stroke.  -Current dietary habits: Does not follow any specific dietary regimen -Current exercise habits: Does not follow any specific exercise regimen  -Denies hypotensive/hypertensive symptoms -Educated on Daily salt intake goal < 2300 mg; -Counseled to monitor BP at home weekly, document, and provide log at future appointments -Recommended to continue current medication  Hyperlipidemia: (LDL goal < 100) -controlled -History of stroke w/ leg and balance deficits  -Current treatment: Atorvastatin 20 mg daily: Appropriate, Effective, Safe, Accessible  -Medications previously tried: NA  -Educated on Importance of limiting foods high in cholesterol; -Recommended to continue current medication  Osteoarthritis (Goal: Minimize pain symptoms) -Controlled -Current treatment  Acetaminophen 500 mg twice daily: Appropriate, Effective, Safe, Accessible  Gabapentin 800 mg twice daily: Appropriate, Effective, Safe, Accessible  -Medications previously tried: NA  -Counseled patient she can try Arthritis Tylenol or increase acetaminophen to 1-2 tablets three times daily as needed. Counseled to limit acetaminophen use to 3000 mg daily.   -Recommended to continue current medication  Patient Goals/Self-Care Activities Over the next 90 days, patient will:  - check blood pressure weekly, document, and provide at future appointments  Follow Up Plan: Telephone follow up appointment with care management team member scheduled for:  06/18/2022 at 11:00 AM       Medication Assistance: None required.  Patient affirms current coverage meets needs.  Patient's preferred pharmacy is:  [facility] [address], [place] [address] Phone: [phone] Fax: [phone]  [id number] [place] [address] [address]Phone: [phone]: [phone] [id number] [place] [address]: [phone]: [phone] pill box? Yes Pt endorses 100% compliance  We discussed: Current pharmacy is preferred with insurance plan and patient is satisfied with pharmacy services Patient decided to: Continue current medication management strategy  Care Plan and Follow Up Patient Decision:  Patient agrees to Care Plan and Follow-up.  Plan: Telephone follow up appointment with care management team member scheduled for:  06/18/2022 at 11:00 AM  AlJunius ArgylePharmD, BCPara [phone]

## 2021-08-14 ENCOUNTER — Other Ambulatory Visit: Payer: Self-pay

## 2021-08-14 ENCOUNTER — Ambulatory Visit (INDEPENDENT_AMBULATORY_CARE_PROVIDER_SITE_OTHER): Payer: Medicare HMO | Admitting: Family Medicine

## 2021-08-14 ENCOUNTER — Encounter: Payer: Self-pay | Admitting: Family Medicine

## 2021-08-14 VITALS — BP 136/59 | HR 61 | Temp 98.5°F | Resp 20 | Wt 190.0 lb

## 2021-08-14 DIAGNOSIS — R202 Paresthesia of skin: Secondary | ICD-10-CM

## 2021-08-14 DIAGNOSIS — E559 Vitamin D deficiency, unspecified: Secondary | ICD-10-CM

## 2021-08-14 DIAGNOSIS — I10 Essential (primary) hypertension: Secondary | ICD-10-CM | POA: Diagnosis not present

## 2021-08-14 DIAGNOSIS — L089 Local infection of the skin and subcutaneous tissue, unspecified: Secondary | ICD-10-CM | POA: Diagnosis not present

## 2021-08-14 DIAGNOSIS — L723 Sebaceous cyst: Secondary | ICD-10-CM

## 2021-08-14 DIAGNOSIS — E7800 Pure hypercholesterolemia, unspecified: Secondary | ICD-10-CM | POA: Diagnosis not present

## 2021-08-14 MED ORDER — DOXYCYCLINE HYCLATE 100 MG PO TABS: 100.0000 mg | ORAL_TABLET | Freq: Two times a day (BID) | ORAL | 0 refills | Status: AC

## 2021-08-14 NOTE — Progress Notes (Signed)
Established patient visit   Patient: Alexis [name]   DOB: 1955/12/10   66 y.o. Female  MRN: [id number] Visit Date: 08/14/2021  Today's healthcare provider: Lelon Huh, MD   Chief Complaint  Patient presents with   Hypertension   Hyperlipidemia   Subjective    HPI  Hypertension, follow-up  BP Readings from Last 3 Encounters:  08/14/21 (!) 136/59  10/18/20 117/67  10/03/20 113/64   Wt Readings from Last 3 Encounters:  08/14/21 190 lb (86.2 kg)  10/18/20 187 lb 6.4 oz (85 kg)  10/03/20 186 lb (84.4 kg)     She was last seen for hypertension 9 months ago.  BP at that visit was 117/67. Management since that visit includes; taking HCTZ. She reports good compliance with treatment. She is not having side effects.  She is exercising. She is not adherent to low salt diet.   Outside blood pressures are not checked.  She does not smoke.  Use of agents associated with hypertension: NSAIDS.   --------------------------------------------------------------------------------------------------- Lipid/Cholesterol, follow-up  Last Lipid Panel: Lab Results  Component Value Date   CHOL 154 04/25/2020   [place] 83 04/25/2020   HDL 54 04/25/2020   TRIG 88 04/25/2020    She was last seen for this 04/25/2020.  Management since that visit includes; labs checked showing-cholesterol was good.  She reports good compliance with treatment. She is not having side effects.   She is following a Regular diet. Current exercise: walking   The ASCVD Risk score (Arnett DK, et al., 2019) failed to calculate for the following reasons:   The patient has a prior MI or stroke diagnosis  ---------------------------------------------------------------------------------------------------  She also reports sore spot on the back of her right thigh for several weeks that she wants checked out.   Medications: Outpatient Medications Prior to Visit  Medication Sig   acetaminophen  (TYLENOL) 500 MG tablet Take 500 mg by mouth in the morning and at bedtime.   aspirin EC 81 MG tablet Take 81 mg by mouth daily.   atorvastatin (LIPITOR) 20 MG tablet Take 1 tablet by mouth once daily   gabapentin (NEURONTIN) 800 MG tablet Take 1 tablet by mouth twice daily   hydrochlorothiazide (HYDRODIURIL) 25 MG tablet Take 1 tablet (25 mg total) by mouth daily.   vitamin B-12 (CYANOCOBALAMIN) 50 MCG tablet Take 50 mcg by mouth daily.   No facility-administered medications prior to visit.    Review of Systems  Constitutional:  Positive for fatigue. Negative for appetite change, chills and fever.  Respiratory:  Negative for chest tightness and shortness of breath.   Cardiovascular:  Negative for chest pain and palpitations.  Gastrointestinal:  Negative for abdominal pain, nausea and vomiting.  Skin:        Sore on the back of right leg  Neurological:  Positive for numbness (in left leg). Negative for dizziness and weakness.      Objective    BP (!) 136/59 (BP Location: Left Arm, Patient Position: Sitting, Cuff Size: Large)    Pulse 61    Temp 98.5 F (36.9 C) (Oral)    Resp 20    Wt 190 lb (86.2 kg)    SpO2 100% Comment: room air   BMI 31.14 kg/m  {Show previous vital signs (optional):23777}  Physical Exam   General: Appearance:    Mildly obese female in no acute distress  Eyes:    PERRL, conjunctiva/corneas clear, EOM's intact       Lungs:  Clear to auscultation bilaterally, respirations unlabored  Heart:    Normal heart rate. Normal rhythm. No murmurs, rubs, or gallops.    MS:   All extremities are intact.    Neurologic:   Awake, alert, oriented x 3. No apparent focal neurological defect.   Skin:   Tender marble sized sebaceous cyst right posterior thigh with scant exudate.       Assessment & Plan     1. Infected sebaceous cyst  - doxycycline (VIBRA-TABS) 100 MG tablet; Take 1 tablet (100 mg total) by mouth 2 (two) times daily for 10 days.  Dispense: 20 tablet;  Refill: 0  Advised to call for dermatology referral if not much better in 1-2 weeks.   2. Benign essential HTN Well controlled.  Continue current medications.    3. Hypercholesteremia She is tolerating atorvastatin well with no adverse effects.   - CBC - Comprehensive metabolic panel - Lipid panel  4. Vitamin D deficiency Is no longer taking B12 supplement.  - VITAMIN D 25 Hydroxy (Vit-D Deficiency, Fractures)  5. Paresthesia of both hands Is currently on b12 supplement.  - Vitamin B12       The entirety of the information documented in the History of Present Illness, Review of Systems and Physical Exam were personally obtained by me. Portions of this information were initially documented by the CMA and reviewed by me for thoroughness and accuracy.     Lelon Huh, MD  [facility] [phone] (phone) [phone] (fax)  [place]

## 2021-08-15 DIAGNOSIS — E7800 Pure hypercholesterolemia, unspecified: Secondary | ICD-10-CM

## 2021-08-15 DIAGNOSIS — I10 Essential (primary) hypertension: Secondary | ICD-10-CM

## 2021-08-16 DIAGNOSIS — E7800 Pure hypercholesterolemia, unspecified: Secondary | ICD-10-CM | POA: Diagnosis not present

## 2021-08-16 DIAGNOSIS — R202 Paresthesia of skin: Secondary | ICD-10-CM | POA: Diagnosis not present

## 2021-08-16 DIAGNOSIS — E559 Vitamin D deficiency, unspecified: Secondary | ICD-10-CM | POA: Diagnosis not present

## 2021-08-17 LAB — CBC
Hematocrit: 41.2 % (ref 34.0–46.6)
Hemoglobin: 13.7 g/dL (ref 11.1–15.9)
MCH: 29 pg (ref 26.6–33.0)
MCHC: 33.3 g/dL (ref 31.5–35.7)
MCV: 87 fL (ref 79–97)
Platelets: 262 10*3/uL (ref 150–450)
RBC: 4.73 x10E6/uL (ref 3.77–5.28)
RDW: 12.2 % (ref 11.7–15.4)
WBC: 5.4 10*3/uL (ref 3.4–10.8)

## 2021-08-17 LAB — LIPID PANEL
Chol/HDL Ratio: 2.3 ratio (ref 0.0–4.4)
Cholesterol, Total: 142 mg/dL (ref 100–199)
HDL: 63 mg/dL (ref >39)
LDL Chol Calc (NIH): 68 mg/dL (ref 0–99)
Triglycerides: 52 mg/dL (ref 0–149)
VLDL Cholesterol Cal: 11 mg/dL (ref 5–40)

## 2021-08-17 LAB — VITAMIN D 25 HYDROXY (VIT D DEFICIENCY, FRACTURES): Vit D, 25-Hydroxy: 26.9 ng/mL — ABNORMAL LOW (ref 30.0–100.0)

## 2021-08-17 LAB — COMPREHENSIVE METABOLIC PANEL
ALT: 25 IU/L (ref 0–32)
AST: 22 IU/L (ref 0–40)
Albumin/Globulin Ratio: 1.7 (ref 1.2–2.2)
Albumin: 4.5 g/dL (ref 3.8–4.8)
Alkaline Phosphatase: 60 IU/L (ref 44–121)
BUN/Creatinine Ratio: 14 (ref 12–28)
BUN: 13 mg/dL (ref 8–27)
Bilirubin Total: 0.4 mg/dL (ref 0.0–1.2)
CO2: 26 mmol/L (ref 20–29)
Calcium: 9.6 mg/dL (ref 8.7–10.3)
Chloride: 103 mmol/L (ref 96–106)
Creatinine, Ser: 0.96 mg/dL (ref 0.57–1.00)
Globulin, Total: 2.7 g/dL (ref 1.5–4.5)
Glucose: 107 mg/dL — ABNORMAL HIGH (ref 70–99)
Potassium: 4.8 mmol/L (ref 3.5–5.2)
Sodium: 143 mmol/L (ref 134–144)
Total Protein: 7.2 g/dL (ref 6.0–8.5)
eGFR: 65 mL/min/{1.73_m2} (ref >59)

## 2021-08-17 LAB — VITAMIN B12: Vitamin B-12: >2000 pg/mL — ABNORMAL HIGH (ref 232–1245)

## 2021-08-25 ENCOUNTER — Telehealth: Payer: Self-pay

## 2021-08-25 DIAGNOSIS — L819 Disorder of pigmentation, unspecified: Secondary | ICD-10-CM | POA: Diagnosis not present

## 2021-08-25 NOTE — Telephone Encounter (Signed)
Please advise 

## 2021-08-25 NOTE — Telephone Encounter (Signed)
Copied from [place] [id number]. Topic: General - Other >> Aug 25, 2021  1:25 PM Valere Dross wrote: Reason for CRM: Pts sister called in stating due to the knot on the pts leg, Dr. Caryn Section was going to send a referral to a dermatologist, and pt was requesting if it could go to the [facility] in [place], [phone], for Tally Joe, MD, PhD. Please advise.

## 2021-08-28 ENCOUNTER — Telehealth: Payer: Self-pay

## 2021-08-28 DIAGNOSIS — D229 Melanocytic nevi, unspecified: Secondary | ICD-10-CM

## 2021-08-28 NOTE — Telephone Encounter (Signed)
Copied from [place] [phone]. Topic: Referral - Request for Referral >> Aug 28, 2021 10:39 AM Tessa Lerner A wrote: Has patient seen PCP for this complaint? Yes.   *If NO, is insurance requiring patient see PCP for this issue before PCP can refer them? Referral for which specialty: Dermatology  Preferred provider/office: [facility]  Reason for referral: moles on face and back of legs

## 2021-08-28 NOTE — Telephone Encounter (Signed)
Please advise on patient referral request.

## 2021-08-28 NOTE — Telephone Encounter (Signed)
Pt's daughter called saying they would like to see Dr. Marolyn Hammock in [place].

## 2021-09-20 DIAGNOSIS — D239 Other benign neoplasm of skin, unspecified: Secondary | ICD-10-CM | POA: Diagnosis not present

## 2021-09-20 DIAGNOSIS — I781 Nevus, non-neoplastic: Secondary | ICD-10-CM | POA: Diagnosis not present

## 2021-09-20 DIAGNOSIS — L821 Other seborrheic keratosis: Secondary | ICD-10-CM | POA: Diagnosis not present

## 2021-09-20 DIAGNOSIS — L02415 Cutaneous abscess of right lower limb: Secondary | ICD-10-CM | POA: Diagnosis not present

## 2021-11-13 ENCOUNTER — Ambulatory Visit (INDEPENDENT_AMBULATORY_CARE_PROVIDER_SITE_OTHER): Payer: Medicare HMO

## 2021-11-13 VITALS — Wt 190.0 lb

## 2021-11-13 DIAGNOSIS — Z0000 Encounter for general adult medical examination without abnormal findings: Secondary | ICD-10-CM
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
## 2021-11-13 NOTE — Patient Instructions (Signed)
Ms. Hoelzer , ?Thank you for taking time to come for your Medicare Wellness Visit. I appreciate your ongoing commitment to your health goals. Please review the following plan we discussed and let me know if I can assist you in the future.  ? ?Screening recommendations/referrals: ?Colonoscopy: 02/14/15, sent referral ?Mammogram: 08/09/21 ?Bone Density: 03/11/19 ?Recommended yearly ophthalmology/optometry visit for glaucoma screening and checkup ?Recommended yearly dental visit for hygiene and checkup ? ?Vaccinations: ?Influenza vaccine: 06/07/21 ?Pneumococcal vaccine: n/d ?Tdap vaccine: n/d ?Shingles vaccine: n/d   ?Covid-19:08/29/19, 09/19/19, 05/19/20 ? ?Advanced directives: no ? ?Conditions/risks identified: none ? ?Next appointment: Follow up in one year for your annual wellness visit 11/15/22 @ 10am by phone ? ? ?Preventive Care 75 Years and Older, Female ?Preventive care refers to lifestyle choices and visits with your health care provider that can promote health and wellness. ?What does preventive care include? ?A yearly physical exam. This is also called an annual well check. ?Dental exams once or twice a year. ?Routine eye exams. Ask your health care provider how often you should have your eyes checked. ?Personal lifestyle choices, including: ?Daily care of your teeth and gums. ?Regular physical activity. ?Eating a healthy diet. ?Avoiding tobacco and drug use. ?Limiting alcohol use. ?Practicing safe sex. ?Taking low-dose aspirin every day. ?Taking vitamin and mineral supplements as recommended by your health care provider. ?What happens during an annual well check? ?The services and screenings done by your health care provider during your annual well check will depend on your age, overall health, lifestyle risk factors, and family history of disease. ?Counseling  ?Your health care provider may ask you questions about your: ?Alcohol use. ?Tobacco use. ?Drug use. ?Emotional well-being. ?Home and relationship  well-being. ?Sexual activity. ?Eating habits. ?History of falls. ?Memory and ability to understand (cognition). ?Work and work Statistician. ?Reproductive health. ?Screening  ?You may have the following tests or measurements: ?Height, weight, and BMI. ?Blood pressure. ?Lipid and cholesterol levels. These may be checked every 5 years, or more frequently if you are over 17 years old. ?Skin check. ?Lung cancer screening. You may have this screening every year starting at age 64 if you have a 30-pack-year history of smoking and currently smoke or have quit within the past 15 years. ?Fecal occult blood test (FOBT) of the stool. You may have this test every year starting at age 96. ?Flexible sigmoidoscopy or colonoscopy. You may have a sigmoidoscopy every 5 years or a colonoscopy every 10 years starting at age 82. ?Hepatitis C blood test. ?Hepatitis B blood test. ?Sexually transmitted disease (STD) testing. ?Diabetes screening. This is done by checking your blood sugar (glucose) after you have not eaten for a while (fasting). You may have this done every 1-3 years. ?Bone density scan. This is done to screen for osteoporosis. You may have this done starting at age 24. ?Mammogram. This may be done every 1-2 years. Talk to your health care provider about how often you should have regular mammograms. ?Talk with your health care provider about your test results, treatment options, and if necessary, the need for more tests. ?Vaccines  ?Your health care provider may recommend certain vaccines, such as: ?Influenza vaccine. This is recommended every year. ?Tetanus, diphtheria, and acellular pertussis (Tdap, Td) vaccine. You may need a Td booster every 10 years. ?Zoster vaccine. You may need this after age 62. ?Pneumococcal 13-valent conjugate (PCV13) vaccine. One dose is recommended after age 22. ?Pneumococcal polysaccharide (PPSV23) vaccine. One dose is recommended after age 40. ?Talk to your health  care provider about which  screenings and vaccines you need and how often you need them. ?This information is not intended to replace advice given to you by your health care provider. Make sure you discuss any questions you have with your health care provider. ?Document Released: 07/29/2015 Document Revised: 03/21/2016 Document Reviewed: 05/03/2015 ?Elsevier Interactive Patient Education ? 2017 [place]. ? ?Fall Prevention in the Home ?Falls can cause injuries. They can happen to people of all ages. There are many things you can do to make your home safe and to help prevent falls. ?What can I do on the outside of my home? ?Regularly fix the edges of walkways and driveways and fix any cracks. ?Remove anything that might make you trip as you walk through a door, such as a raised step or threshold. ?Trim any bushes or trees on the path to your home. ?Use bright outdoor lighting. ?Clear any walking paths of anything that might make someone trip, such as rocks or tools. ?Regularly check to see if handrails are loose or broken. Make sure that both sides of any steps have handrails. ?Any raised decks and porches should have guardrails on the edges. ?Have any leaves, snow, or ice cleared regularly. ?Use sand or salt on walking paths during winter. ?Clean up any spills in your garage right away. This includes oil or grease spills. ?What can I do in the bathroom? ?Use night lights. ?Install grab bars by the toilet and in the tub and shower. Do not use towel bars as grab bars. ?Use non-skid mats or decals in the tub or shower. ?If you need to sit down in the shower, use a plastic, non-slip stool. ?Keep the floor dry. Clean up any water that spills on the floor as soon as it happens. ?Remove soap buildup in the tub or shower regularly. ?Attach bath mats securely with double-sided non-slip rug tape. ?Do not have throw rugs and other things on the floor that can make you trip. ?What can I do in the bedroom? ?Use night lights. ?Make sure that you have a  light by your bed that is easy to reach. ?Do not use any sheets or blankets that are too big for your bed. They should not hang down onto the floor. ?Have a firm chair that has side arms. You can use this for support while you get dressed. ?Do not have throw rugs and other things on the floor that can make you trip. ?What can I do in the kitchen? ?Clean up any spills right away. ?Avoid walking on wet floors. ?Keep items that you use a lot in easy-to-reach places. ?If you need to reach something above you, use a strong step stool that has a grab bar. ?Keep electrical cords out of the way. ?Do not use floor polish or wax that makes floors slippery. If you must use wax, use non-skid floor wax. ?Do not have throw rugs and other things on the floor that can make you trip. ?What can I do with my stairs? ?Do not leave any items on the stairs. ?Make sure that there are handrails on both sides of the stairs and use them. Fix handrails that are broken or loose. Make sure that handrails are as long as the stairways. ?Check any carpeting to make sure that it is firmly attached to the stairs. Fix any carpet that is loose or worn. ?Avoid having throw rugs at the top or bottom of the stairs. If you do have throw rugs, attach them to  the floor with carpet tape. ?Make sure that you have a light switch at the top of the stairs and the bottom of the stairs. If you do not have them, ask someone to add them for you. ?What else can I do to help prevent falls? ?Wear shoes that: ?Do not have high heels. ?Have rubber bottoms. ?Are comfortable and fit you well. ?Are closed at the toe. Do not wear sandals. ?If you use a stepladder: ?Make sure that it is fully opened. Do not climb a closed stepladder. ?Make sure that both sides of the stepladder are locked into place. ?Ask someone to hold it for you, if possible. ?Clearly mark and make sure that you can see: ?Any grab bars or handrails. ?First and last steps. ?Where the edge of each step  is. ?Use tools that help you move around (mobility aids) if they are needed. These include: ?Canes. ?Walkers. ?Scooters. ?Crutches. ?Turn on the lights when you go into a dark area. Replace any light bulbs as soon as they

## 2022-02-07 ENCOUNTER — Ambulatory Visit
Admission: RE | Admit: 2022-02-07 | Discharge: 2022-02-07 | Disposition: A | Discharge status: Home / Self Care | Payer: Medicare HMO | Source: Ambulatory Visit | Attending: Internal Medicine | Admitting: Internal Medicine

## 2022-02-07 ENCOUNTER — Other Ambulatory Visit: Payer: Self-pay | Admitting: Internal Medicine

## 2022-02-07 DIAGNOSIS — M5416 Radiculopathy, lumbar region: Secondary | ICD-10-CM

## 2022-02-07 DIAGNOSIS — M4802 Spinal stenosis, cervical region: Secondary | ICD-10-CM

## 2022-02-16 ENCOUNTER — Ambulatory Visit: Payer: Medicare HMO | Admitting: Family Medicine

## 2022-02-20 ENCOUNTER — Ambulatory Visit: Admission: RE | Admit: 2022-02-20 | Payer: Medicare HMO | Source: Ambulatory Visit

## 2022-04-13 DIAGNOSIS — S6392XA Sprain of unspecified part of left wrist and hand, initial encounter: Secondary | ICD-10-CM | POA: Insufficient documentation

## 2022-05-15 IMAGING — MG DIGITAL SCREENING BILAT W/ TOMO W/ CAD
8 series · 8 of 24 positions shown · non-contrast
Comparison: Previous exam(s).

CLINICAL DATA: Screening.

EXAM:
DIGITAL SCREENING BILATERAL MAMMOGRAM WITH TOMO AND CAD

[L CC synth-2D]
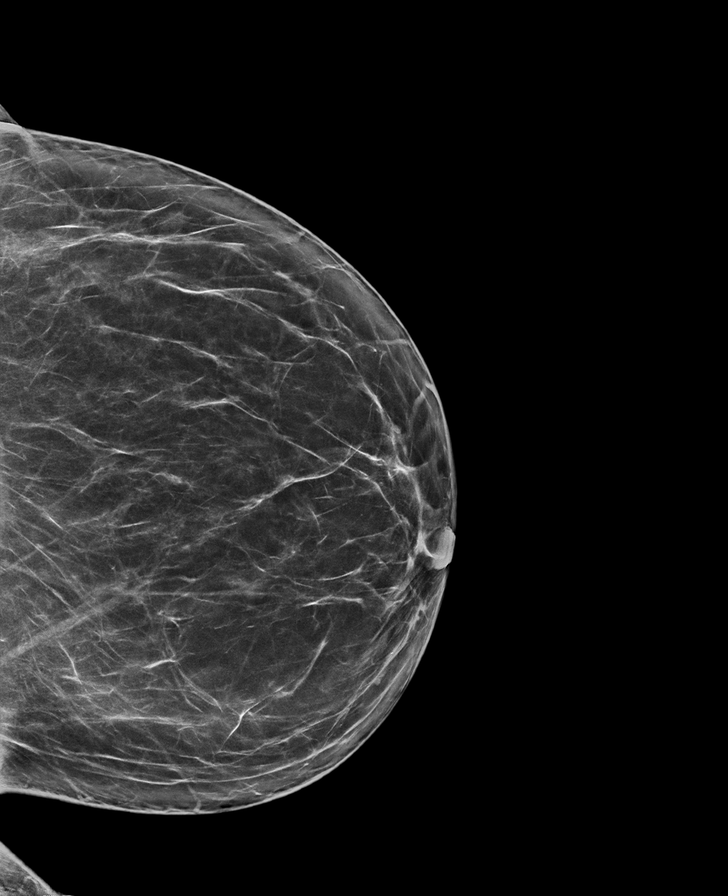

[R CC synth-2D]
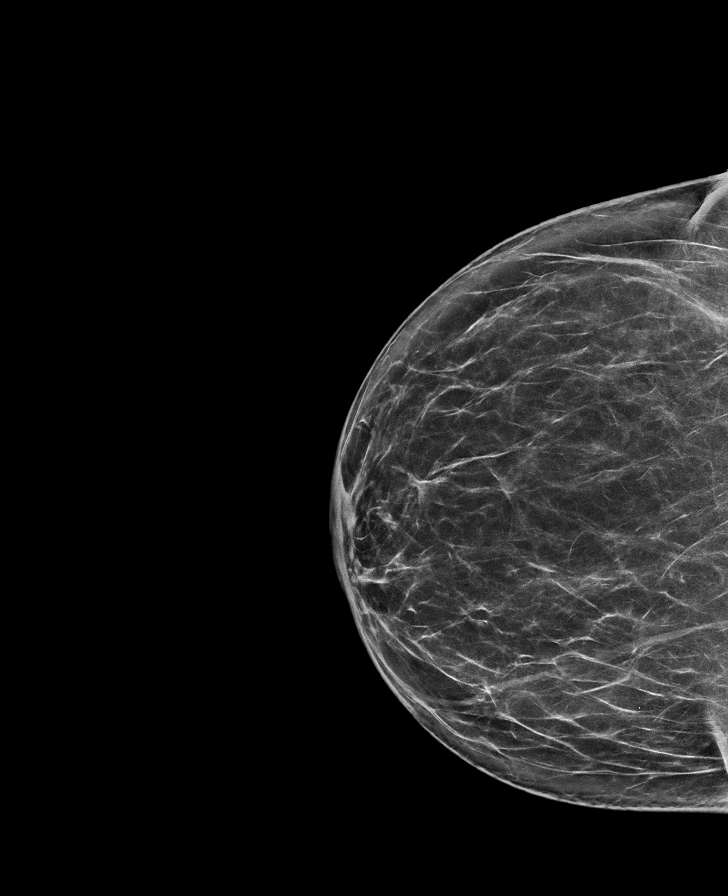

[R MLO synth-2D]
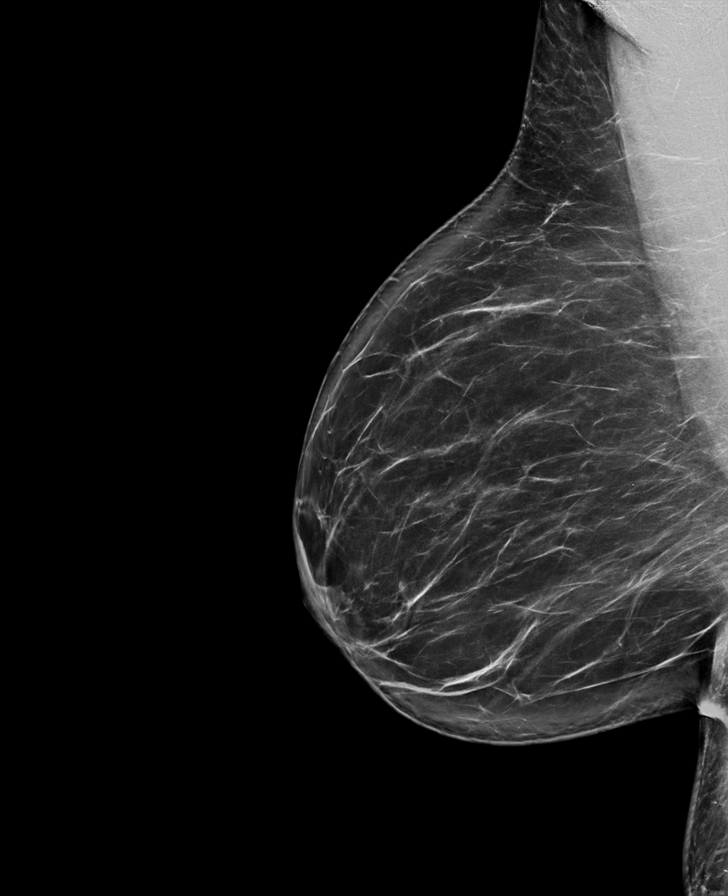

[L MLO synth-2D]
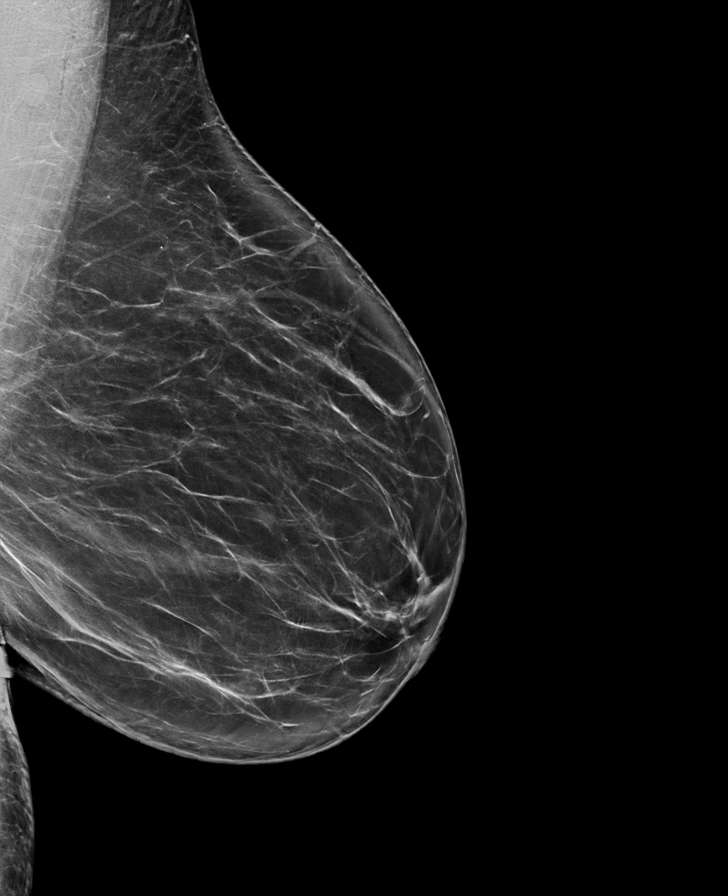

[R CC tomo · tomo slice 40/79.0]
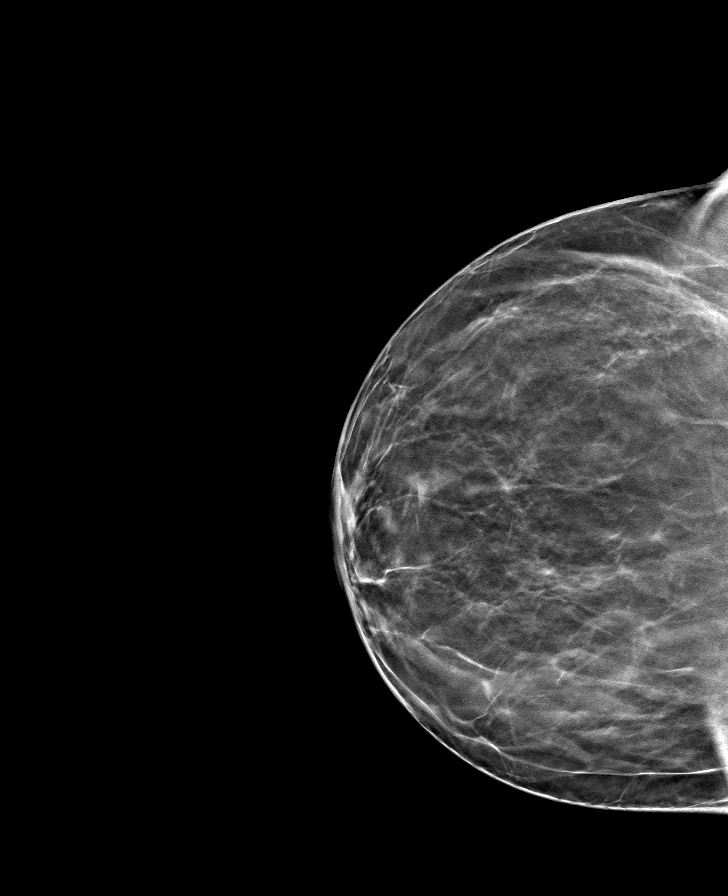

[R MLO tomo · tomo slice 50/99.0]
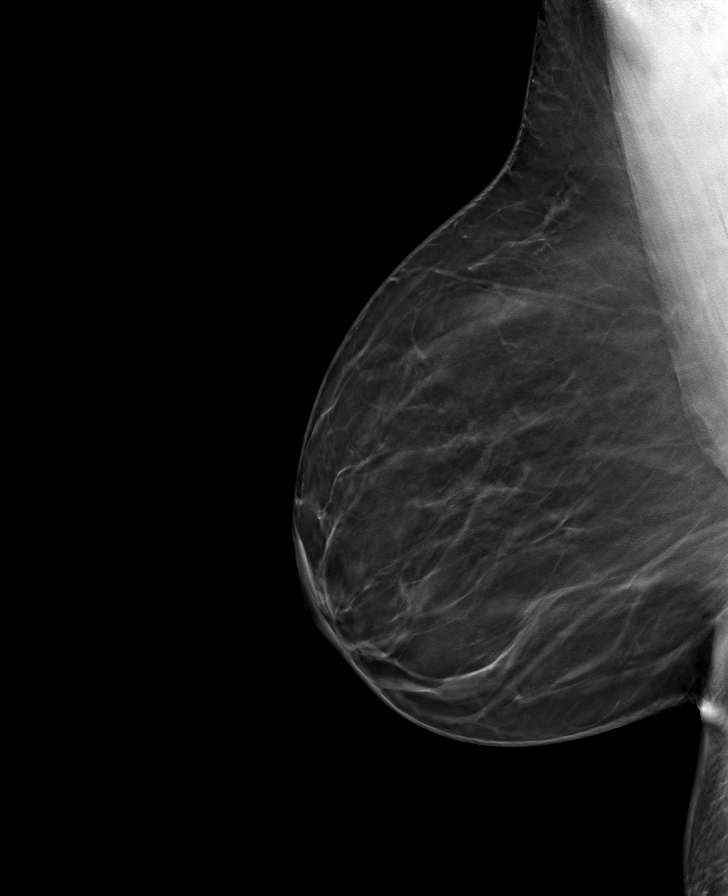

[L MLO tomo · tomo slice 47/93.0]
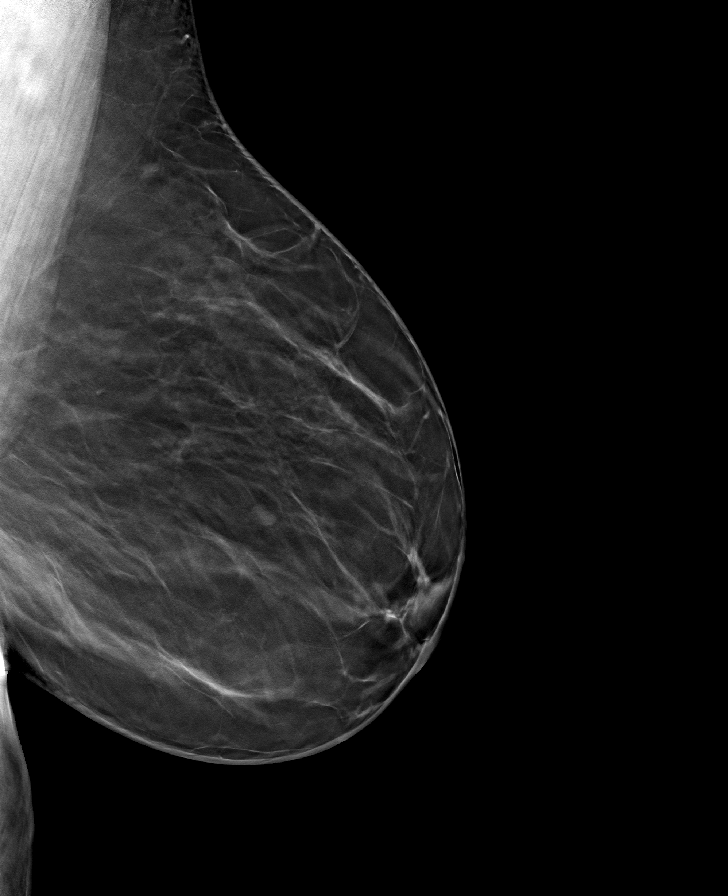

[L CC tomo · tomo slice 40/79.0]
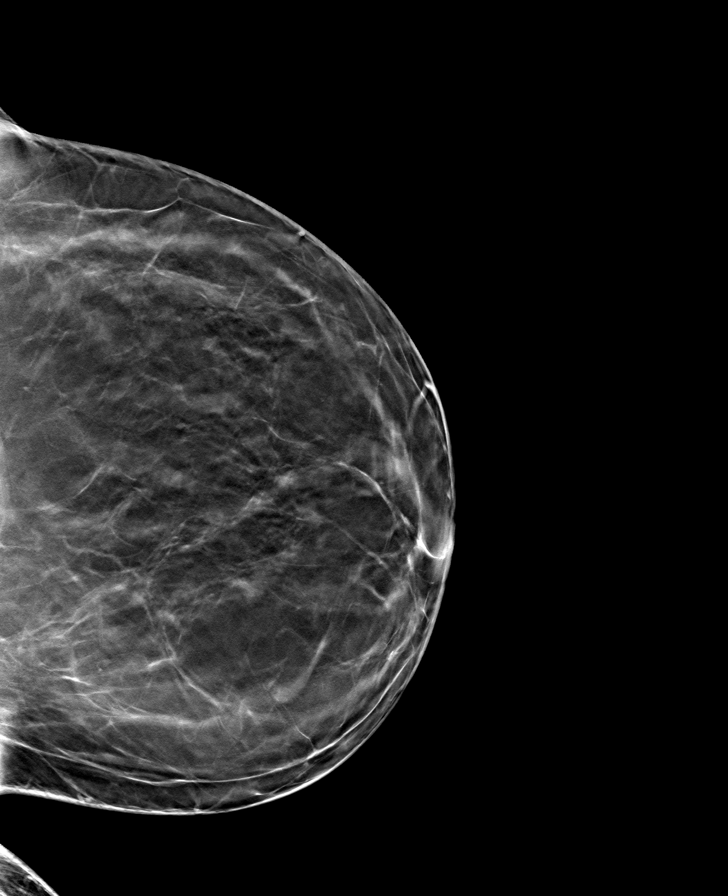

[8 of 24 positions shown; findings below may reference images not displayed]

ACR Breast Density Category b: There are scattered areas of
fibroglandular density.
FINDINGS: There are no findings suspicious for malignancy. Images were
processed with CAD.
IMPRESSION: No mammographic evidence of malignancy. A result letter of this
screening mammogram will be mailed directly to the patient.

RECOMMENDATION:
Screening mammogram in one year. (Code:[id number])

BI-RADS CATEGORY  1: Negative.

## 2022-06-18 ENCOUNTER — Telehealth: Payer: Medicare HMO

## 2022-07-19 ENCOUNTER — Other Ambulatory Visit: Payer: Self-pay | Admitting: Internal Medicine

## 2022-07-19 DIAGNOSIS — Z1231 Encounter for screening mammogram for malignant neoplasm of breast: Secondary | ICD-10-CM

## 2022-08-13 ENCOUNTER — Ambulatory Visit
Admission: RE | Admit: 2022-08-13 | Discharge: 2022-08-13 | Disposition: A | Discharge status: Home / Self Care | Payer: Medicare HMO | Source: Ambulatory Visit | Attending: Internal Medicine | Admitting: Internal Medicine

## 2022-08-13 DIAGNOSIS — Z1231 Encounter for screening mammogram for malignant neoplasm of breast: Secondary | ICD-10-CM | POA: Insufficient documentation

## 2022-08-28 ENCOUNTER — Other Ambulatory Visit: Payer: Self-pay | Admitting: Internal Medicine

## 2022-08-28 DIAGNOSIS — M5416 Radiculopathy, lumbar region: Secondary | ICD-10-CM

## 2022-09-10 ENCOUNTER — Telehealth: Payer: Self-pay | Admitting: Internal Medicine

## 2022-09-10 NOTE — Telephone Encounter (Signed)
Contacted Alexis [name] to schedule their annual wellness visit. Patient declined to schedule AWV at this time. Due to not being a patient at [facility] anymore.   [place] Direct Dial: [phone]

## 2022-09-13 ENCOUNTER — Ambulatory Visit
Admission: RE | Admit: 2022-09-13 | Discharge: 2022-09-13 | Disposition: A | Discharge status: Home / Self Care | Payer: Medicare HMO | Source: Ambulatory Visit | Attending: Internal Medicine | Admitting: Internal Medicine

## 2022-09-13 DIAGNOSIS — M5416 Radiculopathy, lumbar region: Secondary | ICD-10-CM

## 2022-09-24 ENCOUNTER — Ambulatory Visit
Admission: RE | Admit: 2022-09-24 | Discharge: 2022-09-24 | Disposition: A | Discharge status: Home / Self Care | Payer: Medicare HMO | Source: Ambulatory Visit | Attending: Infectious Diseases | Admitting: Infectious Diseases

## 2022-09-24 ENCOUNTER — Other Ambulatory Visit: Payer: Self-pay | Admitting: Infectious Diseases

## 2022-09-24 DIAGNOSIS — M25572 Pain in left ankle and joints of left foot: Secondary | ICD-10-CM

## 2022-10-03 ENCOUNTER — Encounter: Payer: Self-pay | Admitting: Podiatry

## 2022-10-03 ENCOUNTER — Ambulatory Visit: Payer: Medicare HMO | Admitting: Podiatry

## 2022-10-03 ENCOUNTER — Ambulatory Visit (INDEPENDENT_AMBULATORY_CARE_PROVIDER_SITE_OTHER): Payer: Medicare HMO

## 2022-10-03 DIAGNOSIS — M7662 Achilles tendinitis, left leg: Secondary | ICD-10-CM

## 2022-10-03 DIAGNOSIS — M7752 Other enthesopathy of left foot: Secondary | ICD-10-CM

## 2022-10-03 MED ORDER — DEXAMETHASONE SODIUM PHOSPHATE 120 MG/30ML IJ SOLN
2.0000 mg | Freq: Once | INTRAMUSCULAR | Status: AC
  Administered 2022-10-03: 2 mg via INTRA_ARTICULAR

## 2022-10-03 MED ORDER — METHYLPREDNISOLONE 4 MG PO TBPK: ORAL_TABLET | ORAL | 0 refills | Status: DC

## 2022-10-03 MED ORDER — MELOXICAM 15 MG PO TABS: 15.0000 mg | ORAL_TABLET | Freq: Every day | ORAL | 3 refills | Status: DC

## 2022-10-03 NOTE — Progress Notes (Signed)
Subjective:  Patient ID: Alexis [name], female    DOB: 12-13-1955,  MRN: [id number] HPI Chief Complaint  Patient presents with   Foot Pain    Posterior heel/achilles left - aching x 2-3 months, no injury, AM pain, feels tight and pulling when she walk, tried Tylenol   New Patient (Initial Visit)    67 y.o. female presents with the above complaint.   ROS: Denies fever chills nausea vomit muscle aches pains calf pain back pain chest pain shortness of breath.  Past Medical History:  Diagnosis Date   Depression    History of chicken pox    Hyperlipidemia    Hypertension    Hypertrophy of uterus    + uterine fibroids by pelvic ultrasound 2012   Lacunar infarction ([place])    Other tenosynovitis of hand and wrist    Past Surgical History:  Procedure Laterality Date   BACK SURGERY     disc pressing against a nerve x 2   CERVICAL SPINE SURGERY     x 3   Boterro   COLONOSCOPY     COLONOSCOPY WITH PROPOFOL N/A 02/14/2015   Procedure: COLONOSCOPY WITH PROPOFOL;  Surgeon: Hulen Luster, MD;  Location: [facility] ENDOSCOPY;  Service: Gastroenterology;  Laterality: N/A;   POLYPECTOMY     RLE Varicosities  laser surgery    09/2010   varicose vein ligation and stripping     Right    Current Outpatient Medications:    acetaminophen (TYLENOL) 500 MG tablet, Take 500 mg by mouth in the morning and at bedtime., Disp: , Rfl:    donepezil (ARICEPT) 10 MG tablet, Take 10 mg by mouth daily., Disp: , Rfl:    meloxicam (MOBIC) 15 MG tablet, Take 1 tablet (15 mg total) by mouth daily., Disp: 30 tablet, Rfl: 3   methylPREDNISolone (MEDROL DOSEPAK) 4 MG TBPK tablet, 6 day dose pack - take as directed, Disp: 21 tablet, Rfl: 0   aspirin EC 81 MG tablet, Take 81 mg by mouth daily., Disp: , Rfl:    atorvastatin (LIPITOR) 20 MG tablet, Take 1 tablet by mouth once daily, Disp: 90 tablet, Rfl: 3   gabapentin (NEURONTIN) 800 MG tablet, Take 1 tablet by mouth twice daily, Disp: 180 tablet, Rfl: 3   hydrochlorothiazide  (HYDRODIURIL) 25 MG tablet, Take 1 tablet (25 mg total) by mouth daily., Disp: 90 tablet, Rfl: 4   vitamin B-12 (CYANOCOBALAMIN) 50 MCG tablet, Take 50 mcg by mouth daily., Disp: , Rfl:   No Known Allergies Review of Systems Objective:  There were no vitals filed for this visit.  General: Well developed, nourished, in no acute distress, alert and oriented x3   Dermatological: Skin is warm, dry and supple bilateral. Nails x 10 are well maintained; remaining integument appears unremarkable at this time. There are no open sores, no preulcerative lesions, no rash or signs of infection present.  Vascular: Dorsalis Pedis artery and Posterior Tibial artery pedal pulses are 2/4 bilateral with immedate capillary fill time. Pedal hair growth present. No varicosities and no lower extremity edema present bilateral.   Neruologic: Grossly intact via light touch bilateral. Vibratory intact via tuning fork bilateral. Protective threshold with Semmes Wienstein monofilament intact to all pedal sites bilateral. Patellar and Achilles deep tendon reflexes 2+ bilateral. No Babinski or clonus noted bilateral.   Musculoskeletal: No gross boney pedal deformities bilateral. No pain, crepitus, or limitation noted with foot and ankle range of motion bilateral. Muscular strength 5/5 in all groups tested bilateral.  Gait: Unassisted, Nonantalgic.    Radiographs:  Retrocalcaneal heel spurs visible on lateral view otherwise mineralization of the bone appears to be within normal limits osseously mature individual soft tissue increase in density appears to be normal in the proximal Achilles however as distally it does demonstrate considerable thickening and soft tissue edema on the posterior aspect.  Assessment & Plan:   Assessment: Insertional Achilles tendinitis left.  Bursitis left posterior heel.  Plan: Discussed etiology pathology conservative versus surgical therapies at this point injected the bursa today 2 mg of  dexamethasone started her on methylprednisolone to be followed by meloxicam.  Placed her in a short cam boot.  Like to follow-up with her in about 6 weeks.     Bralynn Donado T. [place], [place]

## 2022-10-09 ENCOUNTER — Encounter (HOSPITAL_BASED_OUTPATIENT_CLINIC_OR_DEPARTMENT_OTHER): Payer: Self-pay

## 2022-10-09 DIAGNOSIS — R0683 Snoring: Secondary | ICD-10-CM

## 2022-10-09 DIAGNOSIS — G4710 Hypersomnia, unspecified: Secondary | ICD-10-CM

## 2022-10-09 DIAGNOSIS — G4709 Other insomnia: Secondary | ICD-10-CM

## 2022-10-09 DIAGNOSIS — R5383 Other fatigue: Secondary | ICD-10-CM

## 2022-10-09 DIAGNOSIS — G4733 Obstructive sleep apnea (adult) (pediatric): Secondary | ICD-10-CM

## 2022-10-29 ENCOUNTER — Encounter (HOSPITAL_BASED_OUTPATIENT_CLINIC_OR_DEPARTMENT_OTHER): Payer: Medicare HMO | Admitting: Internal Medicine

## 2022-10-30 ENCOUNTER — Encounter (HOSPITAL_BASED_OUTPATIENT_CLINIC_OR_DEPARTMENT_OTHER): Payer: Medicare HMO | Admitting: Internal Medicine

## 2022-11-05 ENCOUNTER — Ambulatory Visit: Payer: Medicare HMO | Admitting: Podiatry

## 2022-11-14 ENCOUNTER — Ambulatory Visit: Payer: Medicare HMO | Admitting: Podiatry

## 2022-11-23 ENCOUNTER — Ambulatory Visit: Payer: Medicare HMO | Admitting: Podiatry

## 2022-11-23 ENCOUNTER — Encounter: Payer: Self-pay | Admitting: Podiatry

## 2022-11-23 DIAGNOSIS — M7662 Achilles tendinitis, left leg: Secondary | ICD-10-CM

## 2022-11-23 MED ORDER — MELOXICAM 15 MG PO TABS: 15.0000 mg | ORAL_TABLET | Freq: Every day | ORAL | 1 refills | Status: DC

## 2022-11-23 NOTE — Progress Notes (Signed)
   Chief Complaint  Patient presents with   Foot Pain    "It's doing alright."    HPI: 67 y.o. female presenting today for follow-up evaluation of Achilles tendinitis to the left lower extremity.  Patient notices only minimal improvement since last visit.  Last visit she was seen by Dr. Al Corpus who administered cortisone injection, Medrol Dosepak, and cam boot.  She says the cam boot is affecting her back.  Past Medical History:  Diagnosis Date   Depression    History of chicken pox    Hyperlipidemia    Hypertension    Hypertrophy of uterus    + uterine fibroids by pelvic ultrasound 2012   Lacunar infarction (HCC)    Other tenosynovitis of hand and wrist     Past Surgical History:  Procedure Laterality Date   BACK SURGERY     disc pressing against a nerve x 2   CERVICAL SPINE SURGERY     x 3   [place]ro   COLONOSCOPY     COLONOSCOPY WITH PROPOFOL N/A 02/14/2015   Procedure: COLONOSCOPY WITH PROPOFOL;  Surgeon: Wallace Cullens, MD;  Location: [facility] ENDOSCOPY;  Service: Gastroenterology;  Laterality: N/A;   POLYPECTOMY     RLE Varicosities  laser surgery    09/2010   varicose vein ligation and stripping     Right    No Known Allergies   Physical Exam: General: The patient is alert and oriented x3 in no acute distress.  Dermatology: Skin is warm, dry and supple bilateral lower extremities. Negative for open lesions or macerations.  Vascular: Palpable pedal pulses bilaterally. No edema or erythema noted. Capillary refill within normal limits.  Neurological: Grossly intact via light touch  Musculoskeletal Exam: Pain on palpation noted to the posterior tubercle of the left calcaneus at the insertion of the Achilles tendon consistent with retrocalcaneal bursitis. Range of motion within normal limits. Muscle strength 5/5 in all muscle groups bilateral lower extremities.  Radiographic Exam LT foot 10/03/2022:  Posterior calcaneal spur noted to the respective calcaneus on lateral view. No  fracture or dislocation noted. Normal osseous mineralization noted.     Assessment: 1. Insertional Achilles tendinitis left 2.  Posterior heel spur left  Plan of Care:  -Patient was evaluated. Radiographs were reviewed today that were taken 10/03/2022. -I do believe the patient would benefit from physical therapy.  Order placed for physical therapy at [facility] PT -Prescription for meloxicam 15 mg daily -Discontinue cam boot secondary to back pain.  Recommend good supportive tennis shoes and sneakers -Compression ankle sleeve dispensed.  Wear daily -Return to clinic 6 weeks   Felecia Shelling, DPM [facility]  Dr. Felecia Shelling, DPM    [address] [place], [place] [address]                Office [phone]  Fax [phone]

## 2022-11-28 ENCOUNTER — Other Ambulatory Visit: Payer: Self-pay | Admitting: Family Medicine

## 2022-11-28 ENCOUNTER — Ambulatory Visit
Admission: RE | Admit: 2022-11-28 | Discharge: 2022-11-28 | Disposition: A | Discharge status: Home / Self Care | Payer: Medicare HMO | Source: Ambulatory Visit | Attending: Family Medicine | Admitting: Family Medicine

## 2022-11-28 DIAGNOSIS — M25511 Pain in right shoulder: Secondary | ICD-10-CM

## 2023-02-20 ENCOUNTER — Encounter: Payer: Self-pay | Admitting: Podiatry

## 2023-02-20 ENCOUNTER — Ambulatory Visit: Payer: Medicare HMO | Admitting: Podiatry

## 2023-02-20 DIAGNOSIS — S86012A Strain of left Achilles tendon, initial encounter: Secondary | ICD-10-CM | POA: Diagnosis not present

## 2023-02-20 NOTE — Progress Notes (Signed)
She presents today for follow-up of her Achilles tendinitis left.  This has been going on since the beginning of the year.  She states that she is starting to get tired evidence affecting her ability to maintain her general good health she is not able to go to the gym any longer and she states that I have done everything that you have asked me to she has had the injections and physical therapy and icing and wearing the boot she says nothing seems to be helping it is still just throbs all the time and hurts with every step I take.  Objective: Vital signs are stable alert and oriented x 3 pulses are palpable.  She has swelling and pain on palpation of the posterior lateral aspect of the Achilles as it inserts on the posterior superior aspect of the calcaneus.  It is warm to the touch there is no open lesions or wounds associated with this.  I reviewed previous radiographs which do demonstrate soft tissue swelling in the posterior compartment as well as the Achilles at its insertion on the calcaneus.  No significant calcaneal abnormalities.  Assessment: Chronic intractable Achilles tendinitis possible tear particularly interstitial tear of the posterior inferior lateral aspect of the Achilles at its insertion site.  Plan: Since all conservative therapies including cortisone injections prednisone nonsteroidal anti-inflammatories immobilization icing and formal physical therapy has failed to render her asymptomatic I feel MRI would be necessary at this time to evaluate for differential diagnosis and consider surgical planning for repair of this so that she may return to her regular activities and maintain her general good health.

## 2023-02-27 ENCOUNTER — Encounter: Payer: Self-pay | Admitting: Podiatry

## 2023-03-02 ENCOUNTER — Ambulatory Visit
Admission: RE | Admit: 2023-03-02 | Discharge: 2023-03-02 | Disposition: A | Discharge status: Home / Self Care | Payer: Medicare HMO | Source: Ambulatory Visit | Attending: Podiatry | Admitting: Podiatry

## 2023-03-02 DIAGNOSIS — S86012A Strain of left Achilles tendon, initial encounter: Secondary | ICD-10-CM

## 2023-03-07 ENCOUNTER — Telehealth: Payer: Self-pay | Admitting: Podiatry

## 2023-03-07 NOTE — Telephone Encounter (Signed)
 pt called about MRI test results

## 2023-03-13 ENCOUNTER — Telehealth: Payer: Self-pay | Admitting: Urology

## 2023-03-13 NOTE — Telephone Encounter (Signed)
Please call daughter with results from MRI on 03/02/23. [phone]. Thank you!

## 2023-03-14 ENCOUNTER — Telehealth: Payer: Self-pay | Admitting: Podiatry

## 2023-03-14 NOTE — Telephone Encounter (Signed)
Called [facility] and left voicemail that Dr. Al Corpus is requesting an expedited report on this patient for her MRI.

## 2023-03-20 ENCOUNTER — Encounter: Payer: Self-pay | Admitting: Podiatry

## 2023-03-20 ENCOUNTER — Ambulatory Visit: Payer: Medicare HMO | Admitting: Podiatry

## 2023-03-20 DIAGNOSIS — M7662 Achilles tendinitis, left leg: Secondary | ICD-10-CM

## 2023-03-20 NOTE — Progress Notes (Signed)
She presents today as a 67 year old female for follow-up of her pain to her left Achilles tendon.  She has had treatment for quite some time with no avail.  We performed MRI her last visit we ordered that.  She is here today for follow-up.  States that the foot is still hurting it has not gotten any better hurts all day limiting her activity to perform daily duties.  Is limiting her lifestyle and her ability to generally take good physical care of herself.  Objective: Vital signs are stable alert oriented x 3 no change in physical exam.  She has pain on palpation medial calcaneal tubercle pain on palpation to the distal aspect of the Achilles on the left foot.  She also has tenderness on palpation of the os trigonum in the posterior subtalar joint region.  MRI does demonstrate os trigonum syndrome and Achilles tendinosis with a tear of the Achilles at its insertion site.  As well as a chronic proximal Planter fasciitis left foot.  Assessment: Gastroc equinus Achilles tendinitis retrocalcaneal heel spur os trigonum syndrome and Achilles tendon tear.  She also has plantar fasciitis.  Plan: Discussed etiology pathology conservative surgical therapies at this point concerning her today for a gastrocnemius recession and endoscopic plantar fasciotomy a PRP injection to the os trigonum area and a Achilles tenolysis with a retrocalcaneal spur resection and a cast.  We did discuss the possible postop complications associated with this she understands and is amenable to it signed her 3 pages a consent form.  I will follow-up with her in the near future for surgical intervention I did discuss via telephone with her sister Rutherford Nail today our findings and our plans she understands this is amenable to it.

## 2023-03-26 ENCOUNTER — Telehealth: Payer: Self-pay | Admitting: Podiatry

## 2023-03-26 NOTE — Telephone Encounter (Signed)
DOS-04/12/23  EPF [id number] TENOLYSIS [id number] GASTROCNEMIUS RECESS [id number] CALCANEAL OSTEOTOMY-[id number] PRP INJECTION-[id number]  [employer] EFFECTIVE DATE- 12/14/2008 DEDUCTIBLE-$ 1,632.00  COINSURANCE- 20%  PER [employer] WEBSITE, NO PRIOR AUTH IS REQUIRED FOR CPT CODES [id number] AND [id number].  TRACKING # - [id number]

## 2023-04-10 ENCOUNTER — Other Ambulatory Visit: Payer: Self-pay | Admitting: Podiatry

## 2023-04-10 MED ORDER — ONDANSETRON HCL 4 MG PO TABS: 4.0000 mg | ORAL_TABLET | Freq: Three times a day (TID) | ORAL | 0 refills | Status: DC | PRN

## 2023-04-10 MED ORDER — OXYCODONE-ACETAMINOPHEN 10-325 MG PO TABS: 1.0000 | ORAL_TABLET | Freq: Three times a day (TID) | ORAL | 0 refills | Status: AC | PRN

## 2023-04-10 MED ORDER — CEPHALEXIN 500 MG PO CAPS: 500.0000 mg | ORAL_CAPSULE | Freq: Three times a day (TID) | ORAL | 0 refills | Status: DC

## 2023-04-12 DIAGNOSIS — M2042 Other hammer toe(s) (acquired), left foot: Secondary | ICD-10-CM | POA: Diagnosis not present

## 2023-04-12 DIAGNOSIS — M216X2 Other acquired deformities of left foot: Secondary | ICD-10-CM | POA: Diagnosis not present

## 2023-04-12 DIAGNOSIS — M722 Plantar fascial fibromatosis: Secondary | ICD-10-CM | POA: Diagnosis not present

## 2023-04-12 DIAGNOSIS — M7732 Calcaneal spur, left foot: Secondary | ICD-10-CM | POA: Diagnosis not present

## 2023-04-16 ENCOUNTER — Telehealth: Payer: Self-pay | Admitting: Podiatry

## 2023-04-16 NOTE — Telephone Encounter (Signed)
Pts daughter Roxanne Mins left message stating pt had surgery Friday and continues to have  extreme nausea and dizziness. She has been taking the medication. She has some question if you could please call her.

## 2023-04-17 ENCOUNTER — Encounter: Payer: Self-pay | Admitting: Podiatry

## 2023-04-17 ENCOUNTER — Other Ambulatory Visit: Payer: Self-pay | Admitting: Podiatry

## 2023-04-17 ENCOUNTER — Ambulatory Visit (INDEPENDENT_AMBULATORY_CARE_PROVIDER_SITE_OTHER): Payer: Medicare HMO

## 2023-04-17 ENCOUNTER — Ambulatory Visit (INDEPENDENT_AMBULATORY_CARE_PROVIDER_SITE_OTHER): Payer: Medicare HMO | Admitting: Podiatry

## 2023-04-17 VITALS — BP 153/73 | HR 71 | Temp 99.5°F

## 2023-04-17 DIAGNOSIS — M7662 Achilles tendinitis, left leg: Secondary | ICD-10-CM

## 2023-04-17 DIAGNOSIS — Z9889 Other specified postprocedural states: Secondary | ICD-10-CM

## 2023-04-17 NOTE — Progress Notes (Signed)
She presents with her daughter today date of surgery 04/12/2019 for gastroc recession and Achilles tenolysis endoscopic plantar fasciotomy retrocalcaneal heel spur resection and PRP injection and cast application.  She denies fever chills vomiting muscle aches and pains.  She does relate headache nausea and stomach discomfort.  She has discontinued pain medication.  Objective: Vital signs are stable she is alert oriented x 3.  Popliteal pulses palpable capillary fill time to digits 1 through 5 of the left foot are immediate toes are warm to the touch and movable cast is intact though it has some wear on the toe.  Radiographs taken today demonstrate complete resection hypertrophic heel spur.  No gas in the tissues plantar calcaneal spurs present.  Impression: Nausea stomach upset headache most likely from narcotic antibiotic and/or Zofran.  Well-healing surgical foot cast intact left.  Plan: Discussed etiology pathology conservative surgical therapies at this point we left the cast intact discussed discontinuing all medications other than Tylenol and or Advil she understands that we will do that hopefully it will help with her stomach upset.  I did encourage her to try to eat.  I will follow-up with her in 1 to 2 weeks for cast removal and evaluation.  She will call us with questions or concerns.

## 2023-04-24 ENCOUNTER — Ambulatory Visit (INDEPENDENT_AMBULATORY_CARE_PROVIDER_SITE_OTHER): Payer: Medicare HMO | Admitting: Podiatry

## 2023-04-24 ENCOUNTER — Encounter: Payer: Self-pay | Admitting: Podiatry

## 2023-04-24 DIAGNOSIS — M7662 Achilles tendinitis, left leg: Secondary | ICD-10-CM | POA: Diagnosis not present

## 2023-04-24 DIAGNOSIS — Z9889 Other specified postprocedural states: Secondary | ICD-10-CM

## 2023-04-24 NOTE — Progress Notes (Signed)
She presents today with her daughter date of surgery April 12, 2023.  She was a gastroc recession, heel spur resection, Achilles tenolysis, application of cast and a PRP injection, denies fever chills vomiting.  Objective: Cast was intact slightly worn on the forefoot but not terribly so once removed demonstrates dry sterile dressing intact once that was removed staples are intact minimal edema no erythema cellulitis drainage or odor.  She has good range of motion of the ankle and the toes.  Assessment well-healing surgical foot.  Plan: Redressed the left foot today dressed a compressive dressing and below-knee cast.  I will follow-up with her in 2 weeks for cast removal.  She will also have suture and staple removal at that time.  We will also put her in a cam boot continuing nonweightbearing status for approximately 2 more weeks I will follow-up with her at that time.

## 2023-04-25 ENCOUNTER — Ambulatory Visit
Admission: RE | Admit: 2023-04-25 | Discharge: 2023-04-25 | Disposition: A | Discharge status: Home / Self Care | Payer: Medicare HMO | Attending: Internal Medicine | Admitting: Internal Medicine

## 2023-04-25 ENCOUNTER — Other Ambulatory Visit: Payer: Self-pay | Admitting: Internal Medicine

## 2023-04-25 ENCOUNTER — Ambulatory Visit
Admission: RE | Admit: 2023-04-25 | Discharge: 2023-04-25 | Disposition: A | Discharge status: Home / Self Care | Payer: Medicare HMO | Source: Ambulatory Visit | Attending: Internal Medicine | Admitting: Internal Medicine

## 2023-04-25 DIAGNOSIS — R0609 Other forms of dyspnea: Secondary | ICD-10-CM

## 2023-04-25 DIAGNOSIS — R079 Chest pain, unspecified: Secondary | ICD-10-CM | POA: Diagnosis present

## 2023-05-06 ENCOUNTER — Ambulatory Visit (INDEPENDENT_AMBULATORY_CARE_PROVIDER_SITE_OTHER): Payer: Medicare HMO | Admitting: Podiatry

## 2023-05-06 ENCOUNTER — Encounter: Payer: Self-pay | Admitting: Podiatry

## 2023-05-06 VITALS — Ht 65.5 in | Wt 190.0 lb

## 2023-05-06 DIAGNOSIS — M7662 Achilles tendinitis, left leg: Secondary | ICD-10-CM

## 2023-05-06 NOTE — Progress Notes (Signed)
  Subjective:  Patient ID: Alexis [name], female    DOB: 12/17/1955,  MRN: [id number]  Chief Complaint  Patient presents with   Routine Post Op    DOS: 04/12/2023 Procedure: Left Achilles repair EPF and gastroc  67 y.o. female returns for post-op check.  She is doing well her pain is well-controlled  Review of Systems: Negative except as noted in the HPI. Denies N/V/F/Ch.   Objective:  There were no vitals filed for this visit. Body mass index is 31.14 kg/m. Constitutional Well developed. Well nourished.  Vascular Foot warm and well perfused. Capillary refill normal to all digits.  Calf is soft and supple, no posterior calf or knee pain, negative Homans' sign  Neurologic Normal speech. Oriented to person, place, and time. Epicritic sensation to light touch grossly present bilaterally.  Dermatologic Incisions are well-healed  Orthopedic: Little to no to palpation noted about the surgical site.    Assessment:   1. Achilles tendinitis, left leg    Plan:  Patient was evaluated and treated and all questions answered.  S/p foot surgery left -Progressing as expected post-operatively.  All sutures and staples removed today.  She was placed into a removable cam walker boot and may begin bathing but should refrain from ambulating on the foot until her follow-up with Dr. Al Corpus.  Plantarflexion wedges applied into boot and these will be removed gradually after her next visit.  She may begin physical therapy for range of motion and strengthening.  Return to see Dr. Al Corpus in 2 weeks.   Return in about 16 days (around 05/22/2023) for w/ Dr Al Corpus .

## 2023-05-08 ENCOUNTER — Encounter: Payer: Medicare HMO | Admitting: Podiatry

## 2023-05-22 ENCOUNTER — Encounter: Payer: Self-pay | Admitting: Podiatry

## 2023-05-22 ENCOUNTER — Ambulatory Visit (INDEPENDENT_AMBULATORY_CARE_PROVIDER_SITE_OTHER): Payer: Medicare HMO | Admitting: Podiatry

## 2023-05-22 DIAGNOSIS — Z9889 Other specified postprocedural states: Secondary | ICD-10-CM

## 2023-05-22 DIAGNOSIS — M7662 Achilles tendinitis, left leg: Secondary | ICD-10-CM

## 2023-05-22 NOTE — Progress Notes (Signed)
She presents today with her daughter date of surgery 04/12/2023. gastroc recession and Achilles tenolysis EPF retrocalcaneal heel spur resection PRP injection and a   She denies fever chills nausea vomit muscle aches pains states has been a little bit tender but she continues to utilize her knee scooter and wear her cam boot.  She changes her bandage daily and applies Betadine to it.  Objective: Vital signs are stable alert oriented x 3.  Pulses are palpable.  Wound to the distal incision posterior aspect of the heel measures only about 2 cm in length is granulating in there is epithelialization he does have some areas of maceration but all in all it looks pretty good.  She has good plantarflexion against resistance.  No calf pain and no Achilles pain with this.  The only tenderness she has is right over the open wound.  Assessment well-healing surgical foot mild delay with open wound.  Plan: Continue daily dressings allow to air dry for period of time when not ambulating.  If ambulating she is to wear the boot at all times with a dressing over the wound.  She is to start partial weightbearing today.  Will progress to full weightbearing over the next 2 weeks follow-up with her at that time

## 2023-06-05 ENCOUNTER — Ambulatory Visit (INDEPENDENT_AMBULATORY_CARE_PROVIDER_SITE_OTHER): Payer: Medicare HMO | Admitting: Podiatry

## 2023-06-05 ENCOUNTER — Encounter: Payer: Self-pay | Admitting: Podiatry

## 2023-06-05 DIAGNOSIS — M7662 Achilles tendinitis, left leg: Secondary | ICD-10-CM

## 2023-06-05 DIAGNOSIS — Z9889 Other specified postprocedural states: Secondary | ICD-10-CM

## 2023-06-05 NOTE — Progress Notes (Signed)
She presents today date of surgery is 04/12/2023.  She is status post gastroc recession and Achilles repair.  Objective: Vital signs stable she is alert oriented x 3.  Pulses are palpable.  There is no erythema edema cellulitis drainage or odor incision site appears to be healing very nicely with exception of the smallest area at the very distal aspect of the toe measuring less than a half a centimeter in diameter that has not healed but it does have nice granulation tissue and epithelialization appears to be occurring.  No signs of infection.  Assessment: Well-healing surgical foot.  Plan: I will allow her to start walking in a open heeled shoe and if she needs to walk in her cam boot she can do so she states she has been walking around the house without the boot on anyway and that it feels fine.  I did give her some restrictions and I will follow-up with her in 2 to 3 weeks

## 2023-06-08 ENCOUNTER — Emergency Department
Admission: EM | Admit: 2023-06-08 | Discharge: 2023-06-09 | Disposition: A | Discharge status: Home / Self Care | Payer: Medicare HMO | Attending: Emergency Medicine | Admitting: Emergency Medicine

## 2023-06-08 ENCOUNTER — Emergency Department: Payer: Medicare HMO

## 2023-06-08 DIAGNOSIS — R220 Localized swelling, mass and lump, head: Secondary | ICD-10-CM

## 2023-06-08 DIAGNOSIS — L0291 Cutaneous abscess, unspecified: Secondary | ICD-10-CM

## 2023-06-08 DIAGNOSIS — M272 Inflammatory conditions of jaws: Secondary | ICD-10-CM | POA: Diagnosis present

## 2023-06-08 DIAGNOSIS — L0390 Cellulitis, unspecified: Secondary | ICD-10-CM

## 2023-06-08 LAB — CBC WITH DIFFERENTIAL/PLATELET
Abs Immature Granulocytes: 0.02 10*3/uL (ref 0.00–0.07)
Basophils Absolute: 0 10*3/uL (ref 0.0–0.1)
Basophils Relative: 0 %
Eosinophils Absolute: 0.1 10*3/uL (ref 0.0–0.5)
Eosinophils Relative: 0 %
HCT: 39.7 % (ref 36.0–46.0)
Hemoglobin: 12.8 g/dL (ref 12.0–15.0)
Immature Granulocytes: 0 %
Lymphocytes Relative: 25 %
Lymphs Abs: 3.1 10*3/uL (ref 0.7–4.0)
MCH: 28 pg (ref 26.0–34.0)
MCHC: 32.2 g/dL (ref 30.0–36.0)
MCV: 86.9 fL (ref 80.0–100.0)
Monocytes Absolute: 1 10*3/uL (ref 0.1–1.0)
Monocytes Relative: 8 %
Neutro Abs: 8.2 10*3/uL — ABNORMAL HIGH (ref 1.7–7.7)
Neutrophils Relative %: 67 %
Platelets: 379 10*3/uL (ref 150–400)
RBC: 4.57 MIL/uL (ref 3.87–5.11)
RDW: 13.7 % (ref 11.5–15.5)
WBC: 12.4 10*3/uL — ABNORMAL HIGH (ref 4.0–10.5)
nRBC: 0 % (ref 0.0–0.2)

## 2023-06-08 LAB — COMPREHENSIVE METABOLIC PANEL
ALT: 111 U/L — ABNORMAL HIGH (ref 0–44)
AST: 66 U/L — ABNORMAL HIGH (ref 15–41)
Albumin: 3.8 g/dL (ref 3.5–5.0)
Alkaline Phosphatase: 125 U/L (ref 38–126)
Anion gap: 12 (ref 5–15)
BUN: 17 mg/dL (ref 8–23)
CO2: 25 mmol/L (ref 22–32)
Calcium: 9.6 mg/dL (ref 8.9–10.3)
Chloride: 101 mmol/L (ref 98–111)
Creatinine, Ser: 0.82 mg/dL (ref 0.44–1.00)
GFR, Estimated: >60 mL/min (ref >60)
Glucose, Bld: 101 mg/dL — ABNORMAL HIGH (ref 70–99)
Potassium: 4.4 mmol/L (ref 3.5–5.1)
Sodium: 138 mmol/L (ref 135–145)
Total Bilirubin: 1 mg/dL (ref <1.2)
Total Protein: 9 g/dL — ABNORMAL HIGH (ref 6.5–8.1)

## 2023-06-08 MED ORDER — OXYCODONE HCL 5 MG PO TABS
5.0000 mg | ORAL_TABLET | Freq: Once | ORAL | Status: AC
  Administered 2023-06-08: 5 mg via ORAL
  Filled 2023-06-08: qty 1

## 2023-06-08 MED ORDER — LIDOCAINE HCL (PF) 1 % IJ SOLN
5.0000 mL | Freq: Once | INTRAMUSCULAR | Status: AC
  Administered 2023-06-09: 5 mL
  Filled 2023-06-08: qty 5

## 2023-06-08 MED ORDER — SODIUM CHLORIDE 0.9 % IV SOLN
2.0000 g | Freq: Once | INTRAVENOUS | Status: AC
  Administered 2023-06-08: 2 g via INTRAVENOUS
  Filled 2023-06-08: qty 20

## 2023-06-08 MED ORDER — IOHEXOL 300 MG/ML  SOLN
75.0000 mL | Freq: Once | INTRAMUSCULAR | Status: AC | PRN
  Administered 2023-06-08: 75 mL via INTRAVENOUS

## 2023-06-08 NOTE — Discharge Instructions (Addendum)
Your liver test were slightly elevated and you should have this followed up with your primary care doctor.  Follow-up with your primary care doctor for wound recheck. Take oral antibiotics as prescribed and until dose is complete.  Take 800 mg of ibuprofen as needed for pain.  Add low-dose of Tylenol 325 mg to the ibuprofen as a combination if ibuprofen alone does not help with pain.  Apply cool compresses to the area to help with swelling.  Please review skin abscess and cellulitis education package attached to your discharge papers.  Refrain from shaving until completely healed.

## 2023-06-08 NOTE — ED Triage Notes (Signed)
Pt c/o R jaw swelling that started three days ago. Pt denies trouble breathing. No recent fevers. No poor dentition per pt. No new medications or exposure to allergens. Pt does state that she was able to squeeze the area and had serosanguinous drainage

## 2023-06-08 NOTE — ED Provider Notes (Signed)
[facility] Provider Note    Event Date/Time   First MD Initiated Contact with Patient 06/08/23 2019     (approximate)   History   Facial Swelling   HPI  Alexis [name] is a 67 y.o. female who comes in with right jaw swelling.  Patient does report history of dentures.  She reported like 2 or 3 days ago she noticed some swelling on the right bottom of her jaw.  She states that she did try to pinch it and did have a little bit of drainage but is having some increasing redness and swelling noted to that area.  Denies any difficulty swallowing, fevers.   Physical Exam   Triage Vital Signs: ED Triage Vitals  Encounter Vitals Group     BP 06/08/23 1856 (!) 146/96     Systolic BP Percentile --      Diastolic BP Percentile --      Pulse Rate 06/08/23 1856 81     Resp 06/08/23 1856 17     Temp 06/08/23 1856 99 F (37.2 C)     Temp Source 06/08/23 1856 Oral     SpO2 06/08/23 1856 100 %     Weight --      Height --      Head Circumference --      Peak Flow --      Pain Score 06/08/23 1857 8     Pain Loc --      Pain Education --      Exclude from Growth Chart --     Most recent vital signs: Vitals:   06/08/23 1856 06/08/23 2116  BP: (!) 146/96 (!) 161/75  Pulse: 81 69  Resp: 17 16  Temp: 99 F (37.2 C)   SpO2: 100% 98%     General: Awake, no distress.  CV:  Good peripheral perfusion.  Resp:  Normal effort.  Abd:  No distention.  Other:  Patient's dentures were removed I do not see any obvious dental abscess.  She is got swelling noted to the right lower mandible.  There is a little bit of fluctuation noted.  No obvious tenderness inside her mouth    ED Results / Procedures / Treatments   Labs (all labs ordered are listed, but only abnormal results are displayed) Labs Reviewed  CBC WITH DIFFERENTIAL/PLATELET - Abnormal; Notable for the following components:      Result Value   WBC 12.4 (*)    Neutro Abs 8.2 (*)    All other components  within normal limits  COMPREHENSIVE METABOLIC PANEL      RADIOLOGY   PROCEDURES:  Critical Care performed: No  Procedures   MEDICATIONS ORDERED IN ED: Medications  cefTRIAXone (ROCEPHIN) 2 g in sodium chloride 0.9 % 100 mL IVPB (2 g Intravenous New Bag/Given 06/08/23 2110)  oxyCODONE (Oxy IR/ROXICODONE) immediate release tablet 5 mg (5 mg Oral Given 06/08/23 2110)     IMPRESSION / MDM / ASSESSMENT AND PLAN / ED COURSE  I reviewed the triage vital signs and the nursing notes.   Patient's presentation is most consistent with acute presentation with potential threat to life or bodily function.   He comes in with concerns for jaw swelling.  She is got some tenderness on the right jaw.  I will get CT imaging to rule out any type of abscess that goes deeper, consideration of Ludwig's angina but on examination it seems a pretty superficial abscess just on the jaw.  She  has no tenderness in the floor of her mouth difficulty swallowing.  Her submandibular area feels pretty soft on examination.  Her CBC shows elevated white count.  Her CMP is reassuring.  Patient handed off pending CT imaging.   FINAL CLINICAL IMPRESSION(S) / ED DIAGNOSES   Final diagnoses:  Jaw swelling     Rx / DC Orders   ED Discharge Orders     None        Note:  This document was prepared using Dragon voice recognition software and may include unintentional dictation errors.   Concha Se, MD 06/08/23 2204

## 2023-06-09 MED ORDER — IBUPROFEN 800 MG PO TABS: 800.0000 mg | ORAL_TABLET | Freq: Three times a day (TID) | ORAL | 0 refills | Status: AC | PRN

## 2023-06-09 MED ORDER — CEPHALEXIN 500 MG PO CAPS: 500.0000 mg | ORAL_CAPSULE | Freq: Four times a day (QID) | ORAL | 0 refills | Status: AC

## 2023-06-09 MED ORDER — SULFAMETHOXAZOLE-TRIMETHOPRIM 800-160 MG PO TABS: 1.0000 | ORAL_TABLET | Freq: Two times a day (BID) | ORAL | 0 refills | Status: DC

## 2023-06-09 NOTE — ED Provider Notes (Signed)
----------------------------------------- 10:19 AM on 06/09/2023 -----------------------------------------  Blood pressure (!) 161/75, pulse 69, temperature 99 F (37.2 C), temperature source Oral, resp. rate 16, SpO2 98%.  Assuming care from Dr. Fuller Plan.  In short, Alexis [name] is a 67 y.o. female with a chief complaint of Facial Swelling .  Refer to the original H&P for additional details.  The current plan of care is to await CT maxillofacial and appropriate disposition.  CT maxillofacial reveals cellulitis and an abscess.  No evidence of periodontal disease.  See procedure note below.  I&D performed with moderate amount of drainage.  Patient will be placed on Keflex and Bactrim.  She is encouraged to follow-up with her primary care in 1 week.  Patient verbalized understanding.  ED precautions discussed.    [facility] Provider Note   CSN: [id number] Arrival date & time: 06/08/23  1843     History  Chief Complaint  Patient presents with   Facial Swelling    Home Medications Prior to Admission medications   Medication Sig Start Date End Date Taking? Authorizing Provider  cephALEXin (KEFLEX) 500 MG capsule Take 1 capsule (500 mg total) by mouth 4 (four) times daily for 10 days. 06/09/23 06/19/23 Yes Tevion Laforge A, PA-C  ibuprofen (ADVIL) 800 MG tablet Take 1 tablet (800 mg total) by mouth every 8 (eight) hours as needed. 06/09/23  Yes Jeraldine Primeau A, PA-C  sulfamethoxazole-trimethoprim (BACTRIM DS) 800-160 MG tablet Take 1 tablet by mouth 2 (two) times daily. 06/09/23  Yes Melaina Howerton A, PA-C  acetaminophen (TYLENOL) 500 MG tablet Take 500 mg by mouth in the morning and at bedtime.    [provider]  aspirin EC 81 MG tablet Take 81 mg by mouth daily.    [provider]  atorvastatin (LIPITOR) 20 MG tablet Take 1 tablet by mouth once daily 05/03/21   Malva Limes, MD  donepezil (ARICEPT) 10 MG tablet Take  10 mg by mouth daily. 08/20/22   [provider]  gabapentin (NEURONTIN) 800 MG tablet Take 1 tablet by mouth twice daily 05/03/21   Malva Limes, MD  hydrochlorothiazide (HYDRODIURIL) 25 MG tablet Take 1 tablet (25 mg total) by mouth daily. 11/14/20   Malva Limes, MD  tiZANidine (ZANAFLEX) 2 MG tablet Take 2 mg by mouth at bedtime. 02/22/23   [provider]  vitamin B-12 (CYANOCOBALAMIN) 50 MCG tablet Take 50 mcg by mouth daily.    [provider]      Allergies    Patient has no known allergies.    Review of Systems   Review of Systems  Physical Exam Updated Vital Signs BP (!) 162/87 (BP Location: Left Arm)   Pulse 67   Temp (!) 100.5 F (38.1 C) (Oral)   Resp 16   SpO2 96%  Physical Exam  ED Results / Procedures / Treatments   Labs (all labs ordered are listed, but only abnormal results are displayed) Labs Reviewed  CBC WITH DIFFERENTIAL/PLATELET - Abnormal; Notable for the following components:      Result Value   WBC 12.4 (*)    Neutro Abs 8.2 (*)    All other components within normal limits  COMPREHENSIVE METABOLIC PANEL - Abnormal; Notable for the following components:   Glucose, Bld 101 (*)    Total Protein 9.0 (*)    AST 66 (*)    ALT 111 (*)    All other components within normal limits  EKG None  Radiology CT Maxillofacial W Contrast  Result Date: 06/08/2023 CLINICAL DATA:  Right jaw swelling, concern for sublingual or submandibular abscess EXAM: CT MAXILLOFACIAL WITH CONTRAST TECHNIQUE: Multidetector CT imaging of the maxillofacial structures was performed with intravenous contrast. Multiplanar CT image reconstructions were also generated. RADIATION DOSE REDUCTION: This exam was performed according to the departmental dose-optimization program which includes automated exposure control, adjustment of the mA and/or kV according to patient size and/or use of iterative reconstruction technique. CONTRAST:  75mL OMNIPAQUE IOHEXOL  300 MG/ML  SOLN COMPARISON:  No prior CT maxillofacial available, correlation is made with 10/03/2020 CT head FINDINGS: Osseous: No fracture or mandibular dislocation. No destructive process. Absent maxillary dentition. The remain mandibular teeth show no signs of periapical lucency or dental caries. Orbits: Negative. No traumatic or inflammatory finding. Sinuses: Clear. Soft tissues: Enhancement and stranding in the right facial and buccal soft tissues overlying the mandible (series 6, image 41). Small low-density collection within this enhancement, which measures up to 1.8 x 0.3 x 1.0 cm (AP x TR x CC) (series 2, image 17 and series 7, image 35). This collection is in close proximity to the right mental foramen (series 2, image 20), although it does not appear in continuity with the foramen. Thickening and hyperenhancement of the right platysma. Prominent level 1A lymph nodes measure up to 9 mm in short axis. Prominent right level 1B lymph nodes measure to 7 mm in short axis. No abnormal density lymph nodes. Limited intracranial: No acute finding. IMPRESSION: 1. Enhancement in the right facial and buccal soft tissues overlying the mandible, likely cellulitis, with a small low-density collection, which measures up to 1.8 x 0.3 x 1.0 cm, concerning for abscess. No evidence of periodontal disease to suggest odontogenic abscess. 2. Prominent right level 1A and 1B lymph nodes, likely reactive. No suppurative lymphadenopathy. Electronically Signed   By: Wiliam Ke M.D.   On: 06/08/2023 22:49    Procedures .Marland KitchenIncision and Drainage  Date/Time: 06/09/2023 12:26 AM  Performed by: Conrad Bluff, PA-C Authorized by: Kern Reap A, PA-C   Consent:    Consent obtained:  Verbal   Consent given by:  Patient   Risks discussed:  Bleeding, incomplete drainage, pain and infection Location:    Type:  Abscess   Size:  3cm   Location:  Mouth (lower jaw) Pre-procedure details:    Skin preparation:   Povidone-iodine Anesthesia:    Anesthesia method:  Local infiltration   Local anesthetic:  Lidocaine 1% WITH epi Procedure type:    Complexity:  Simple Procedure details:    Needle aspiration: yes     Needle size:  25 G   Incision types:  Single straight   Drainage:  Serosanguinous   Drainage amount:  Moderate   Wound treatment:  Wound left open   Packing materials:  None Post-procedure details:    Procedure completion:  Tolerated   Medications Ordered in ED Medications  lidocaine (PF) (XYLOCAINE) 1 % injection 5 mL (has no administration in time range)  cefTRIAXone (ROCEPHIN) 2 g in sodium chloride 0.9 % 100 mL IVPB (0 g Intravenous Stopped 06/08/23 2225)  oxyCODONE (Oxy IR/ROXICODONE) immediate release tablet 5 mg (5 mg Oral Given 06/08/23 2110)  iohexol (OMNIPAQUE) 300 MG/ML solution 75 mL (75 mLs Intravenous Contrast Given 06/08/23 2159)    ED Course/ Medical Decision Making/ A&P    Final Clinical Impression(s) / ED Diagnoses Final diagnoses:  Jaw swelling  Abscess  Cellulitis, unspecified cellulitis site  Rx / DC Orders ED Discharge Orders          Ordered    cephALEXin (KEFLEX) 500 MG capsule  4 times daily        06/09/23 0021    sulfamethoxazole-trimethoprim (BACTRIM DS) 800-160 MG tablet  2 times daily        06/09/23 0021    ibuprofen (ADVIL) 800 MG tablet  Every 8 hours PRN        06/09/23 Jenne Campus, Brynnly Bonet A, PA-C 06/09/23 0031    Concha Se, MD 06/09/23 [phone]

## 2023-06-09 NOTE — ED Notes (Signed)
RN informed about Pt running a fever when this EDT went to reassess pt vitals.

## 2023-06-26 ENCOUNTER — Encounter: Payer: Self-pay | Admitting: Podiatry

## 2023-06-26 ENCOUNTER — Ambulatory Visit (INDEPENDENT_AMBULATORY_CARE_PROVIDER_SITE_OTHER): Payer: Medicare HMO | Admitting: Podiatry

## 2023-06-26 ENCOUNTER — Encounter: Payer: Medicare HMO | Admitting: Podiatry

## 2023-06-26 DIAGNOSIS — M7662 Achilles tendinitis, left leg: Secondary | ICD-10-CM

## 2023-06-26 DIAGNOSIS — Z9889 Other specified postprocedural states: Secondary | ICD-10-CM

## 2023-06-26 NOTE — Progress Notes (Signed)
She presents today for follow-up of her surgery.  Date of surgery was April 12, 2023.  Gastroc recession Achilles tenolysis EPF and retrocalcaneal heel spur resection with PRP injection and cast application.  She states I am doing good is a little sore at times but I am ready from a tennis shoes as she brings 1 with her today.  She denies fever chills nausea vomit muscle aches pains calf pain back pain chest pain shortness of breath.  Objective: Vital signs stable oriented x 3 her incisions have gone on to heal uneventfully she has good plantarflexion against resistance no tenderness on palpation tendon appears to be decreasing in size back to more of a normal contour.  Assessment: Well-healing surgical foot and ankle and leg left.  Plan: Placed her in a Tri-Lock brace today and she attempted to get into a tennis shoe but was unable to do so but she will start walking in her tennis shoes as long as is not bothering the back of her surgical site.  She will follow-up with me in about 1 month for her final checkup.

## 2023-07-01 ENCOUNTER — Encounter: Payer: Medicare HMO | Admitting: Podiatry

## 2023-07-04 ENCOUNTER — Other Ambulatory Visit: Payer: Self-pay | Admitting: Internal Medicine

## 2023-07-04 DIAGNOSIS — Z13820 Encounter for screening for osteoporosis: Secondary | ICD-10-CM

## 2023-07-04 DIAGNOSIS — Z1231 Encounter for screening mammogram for malignant neoplasm of breast: Secondary | ICD-10-CM

## 2023-07-24 ENCOUNTER — Encounter: Payer: Medicare HMO | Admitting: Podiatry

## 2023-08-07 ENCOUNTER — Encounter: Payer: Medicare HMO | Admitting: Podiatry

## 2023-08-26 ENCOUNTER — Encounter: Payer: Self-pay | Admitting: Podiatry

## 2023-08-26 ENCOUNTER — Ambulatory Visit (INDEPENDENT_AMBULATORY_CARE_PROVIDER_SITE_OTHER): Payer: Medicare HMO | Admitting: Podiatry

## 2023-08-26 DIAGNOSIS — Z9889 Other specified postprocedural states: Secondary | ICD-10-CM | POA: Diagnosis not present

## 2023-08-26 DIAGNOSIS — M7662 Achilles tendinitis, left leg: Secondary | ICD-10-CM

## 2023-08-26 NOTE — Progress Notes (Signed)
 She presents today for follow-up of her surgery to her left leg.  Date of surgery 04/12/2023.  Retrocalcaneal heel spur resection PRP injection gastroc recession and then EPF with cast.  She states that she is doing well though she starts to get sharp shooting pains occasionally and pulling in the heel.  She states that is still so much better than it was prior to surgery these things are starting to bother her.  Objective: Vital signs are stable alert oriented x 3 there is no erythema edema cellulitis drainage or odor.  She has no swelling around the Achilles tendon which appears to be normal she has some thickening of the scar distally but other than that this is normal and she has 5 out of 5 plantarflexion ability.  Assessment: Well-healing surgical foot postsurgical tenderness.  Plan: I am going to recommend physical therapy for motion and strengthening and pain relief.  Follow-up with me when she is done with them.

## 2023-10-17 ENCOUNTER — Encounter (INDEPENDENT_AMBULATORY_CARE_PROVIDER_SITE_OTHER): Admitting: Ophthalmology

## 2023-11-04 ENCOUNTER — Ambulatory Visit
Admission: RE | Admit: 2023-11-04 | Discharge: 2023-11-04 | Disposition: A | Discharge status: Home / Self Care | Source: Ambulatory Visit | Attending: Internal Medicine | Admitting: Internal Medicine

## 2023-11-04 DIAGNOSIS — M8589 Other specified disorders of bone density and structure, multiple sites: Secondary | ICD-10-CM | POA: Insufficient documentation

## 2023-11-04 DIAGNOSIS — Z1231 Encounter for screening mammogram for malignant neoplasm of breast: Secondary | ICD-10-CM | POA: Diagnosis present

## 2023-11-04 DIAGNOSIS — Z13820 Encounter for screening for osteoporosis: Secondary | ICD-10-CM | POA: Diagnosis present
